# Patient Record
Sex: Female | Born: 1955 | Race: Black or African American | Hispanic: No | Smoking: Former smoker
Health system: Southern US, Community
[De-identification: ages and names within clinical notes are randomized; demographics above are authoritative.]

## PROBLEM LIST (undated history)

## (undated) DIAGNOSIS — N760 Acute vaginitis: Secondary | ICD-10-CM

## (undated) DIAGNOSIS — I10 Essential (primary) hypertension: Secondary | ICD-10-CM

## (undated) DIAGNOSIS — J302 Other seasonal allergic rhinitis: Secondary | ICD-10-CM

## (undated) DIAGNOSIS — IMO0001 Reserved for inherently not codable concepts without codable children: Secondary | ICD-10-CM

## (undated) DIAGNOSIS — L0291 Cutaneous abscess, unspecified: Secondary | ICD-10-CM

## (undated) DIAGNOSIS — J45909 Unspecified asthma, uncomplicated: Secondary | ICD-10-CM

## (undated) DIAGNOSIS — N824 Other female intestinal-genital tract fistulae: Secondary | ICD-10-CM

## (undated) DIAGNOSIS — E669 Obesity, unspecified: Secondary | ICD-10-CM

## (undated) DIAGNOSIS — E785 Hyperlipidemia, unspecified: Secondary | ICD-10-CM

## (undated) DIAGNOSIS — L0390 Cellulitis, unspecified: Secondary | ICD-10-CM

## (undated) DIAGNOSIS — K219 Gastro-esophageal reflux disease without esophagitis: Secondary | ICD-10-CM

## (undated) DIAGNOSIS — E1165 Type 2 diabetes mellitus with hyperglycemia: Secondary | ICD-10-CM

## (undated) HISTORY — DX: Unspecified asthma, uncomplicated: J45.909

## (undated) HISTORY — DX: Reserved for inherently not codable concepts without codable children: IMO0001

## (undated) HISTORY — DX: Cellulitis, unspecified: L03.90

## (undated) HISTORY — DX: Other seasonal allergic rhinitis: J30.2

## (undated) HISTORY — PX: ESOPHAGOGASTRODUODENOSCOPY: SHX1529

## (undated) HISTORY — DX: Obesity, unspecified: E66.9

## (undated) HISTORY — DX: Type 2 diabetes mellitus with hyperglycemia: E11.65

## (undated) HISTORY — DX: Other female intestinal-genital tract fistulae: N82.4

## (undated) HISTORY — DX: Essential (primary) hypertension: I10

## (undated) HISTORY — DX: Cutaneous abscess, unspecified: L02.91

## (undated) HISTORY — DX: Acute vaginitis: N76.0

## (undated) HISTORY — DX: Hyperlipidemia, unspecified: E78.5

## (undated) HISTORY — PX: OTHER SURGICAL HISTORY: SHX169

## (undated) HISTORY — DX: Gastro-esophageal reflux disease without esophagitis: K21.9

---

## 1999-07-19 ENCOUNTER — Encounter (INDEPENDENT_AMBULATORY_CARE_PROVIDER_SITE_OTHER): Payer: Self-pay | Admitting: Specialist

## 1999-07-19 ENCOUNTER — Other Ambulatory Visit: Admission: RE | Admit: 1999-07-19 | Discharge: 1999-07-19 | Payer: Self-pay | Admitting: Obstetrics and Gynecology

## 1999-11-24 DIAGNOSIS — E119 Type 2 diabetes mellitus without complications: Secondary | ICD-10-CM | POA: Insufficient documentation

## 2002-04-15 ENCOUNTER — Other Ambulatory Visit: Admission: RE | Admit: 2002-04-15 | Discharge: 2002-04-15 | Payer: Self-pay | Admitting: Obstetrics and Gynecology

## 2002-09-04 ENCOUNTER — Encounter: Payer: Self-pay | Admitting: Obstetrics and Gynecology

## 2002-09-04 ENCOUNTER — Encounter: Admission: RE | Admit: 2002-09-04 | Discharge: 2002-09-04 | Payer: Self-pay | Admitting: Obstetrics and Gynecology

## 2002-10-27 ENCOUNTER — Encounter (HOSPITAL_BASED_OUTPATIENT_CLINIC_OR_DEPARTMENT_OTHER): Payer: Self-pay | Admitting: General Surgery

## 2002-10-29 ENCOUNTER — Ambulatory Visit (HOSPITAL_COMMUNITY): Admission: RE | Admit: 2002-10-29 | Discharge: 2002-10-29 | Payer: Self-pay | Admitting: General Surgery

## 2002-10-29 ENCOUNTER — Encounter (INDEPENDENT_AMBULATORY_CARE_PROVIDER_SITE_OTHER): Payer: Self-pay | Admitting: *Deleted

## 2003-05-28 ENCOUNTER — Other Ambulatory Visit: Admission: RE | Admit: 2003-05-28 | Discharge: 2003-05-28 | Payer: Self-pay | Admitting: Obstetrics and Gynecology

## 2003-11-18 ENCOUNTER — Encounter: Admission: RE | Admit: 2003-11-18 | Discharge: 2003-11-18 | Payer: Self-pay | Admitting: Internal Medicine

## 2003-11-30 ENCOUNTER — Encounter: Admission: RE | Admit: 2003-11-30 | Discharge: 2003-11-30 | Payer: Self-pay | Admitting: Internal Medicine

## 2003-12-04 ENCOUNTER — Encounter: Admission: RE | Admit: 2003-12-04 | Discharge: 2003-12-04 | Payer: Self-pay | Admitting: Obstetrics and Gynecology

## 2004-06-16 ENCOUNTER — Other Ambulatory Visit: Admission: RE | Admit: 2004-06-16 | Discharge: 2004-06-16 | Payer: Self-pay | Admitting: Obstetrics and Gynecology

## 2004-12-15 ENCOUNTER — Encounter: Admission: RE | Admit: 2004-12-15 | Discharge: 2004-12-15 | Payer: Self-pay | Admitting: Obstetrics and Gynecology

## 2005-04-13 ENCOUNTER — Encounter: Admission: RE | Admit: 2005-04-13 | Discharge: 2005-04-13 | Payer: Self-pay | Admitting: Internal Medicine

## 2005-06-07 ENCOUNTER — Other Ambulatory Visit: Admission: RE | Admit: 2005-06-07 | Discharge: 2005-06-07 | Payer: Self-pay | Admitting: Obstetrics and Gynecology

## 2005-09-25 ENCOUNTER — Encounter: Admission: RE | Admit: 2005-09-25 | Discharge: 2005-09-25 | Payer: Self-pay | Admitting: Gastroenterology

## 2006-03-20 ENCOUNTER — Ambulatory Visit (HOSPITAL_COMMUNITY): Admission: RE | Admit: 2006-03-20 | Discharge: 2006-03-20 | Payer: Self-pay | Admitting: Internal Medicine

## 2006-03-20 ENCOUNTER — Encounter (INDEPENDENT_AMBULATORY_CARE_PROVIDER_SITE_OTHER): Payer: Self-pay | Admitting: *Deleted

## 2006-04-09 ENCOUNTER — Encounter: Admission: RE | Admit: 2006-04-09 | Discharge: 2006-04-09 | Payer: Self-pay | Admitting: Obstetrics and Gynecology

## 2006-05-01 ENCOUNTER — Other Ambulatory Visit: Admission: RE | Admit: 2006-05-01 | Discharge: 2006-05-01 | Payer: Self-pay | Admitting: Obstetrics and Gynecology

## 2006-06-04 ENCOUNTER — Ambulatory Visit: Payer: Self-pay | Admitting: Gastroenterology

## 2006-06-15 ENCOUNTER — Ambulatory Visit: Payer: Self-pay | Admitting: Gastroenterology

## 2006-08-31 ENCOUNTER — Ambulatory Visit (HOSPITAL_COMMUNITY): Admission: RE | Admit: 2006-08-31 | Discharge: 2006-08-31 | Payer: Self-pay | Admitting: Gastroenterology

## 2006-09-18 ENCOUNTER — Ambulatory Visit (HOSPITAL_COMMUNITY): Admission: RE | Admit: 2006-09-18 | Discharge: 2006-09-18 | Payer: Self-pay | Admitting: Gastroenterology

## 2006-10-24 ENCOUNTER — Ambulatory Visit (HOSPITAL_COMMUNITY): Admission: RE | Admit: 2006-10-24 | Discharge: 2006-10-24 | Payer: Self-pay | Admitting: Gastroenterology

## 2007-03-29 ENCOUNTER — Encounter: Admission: RE | Admit: 2007-03-29 | Discharge: 2007-03-29 | Payer: Self-pay | Admitting: Internal Medicine

## 2007-04-05 ENCOUNTER — Encounter (HOSPITAL_BASED_OUTPATIENT_CLINIC_OR_DEPARTMENT_OTHER): Payer: Self-pay | Admitting: General Surgery

## 2007-04-05 ENCOUNTER — Ambulatory Visit (HOSPITAL_BASED_OUTPATIENT_CLINIC_OR_DEPARTMENT_OTHER): Admission: RE | Admit: 2007-04-05 | Discharge: 2007-04-05 | Payer: Self-pay | Admitting: General Surgery

## 2007-06-12 ENCOUNTER — Encounter: Admission: RE | Admit: 2007-06-12 | Discharge: 2007-06-12 | Payer: Self-pay | Admitting: Obstetrics and Gynecology

## 2008-05-28 ENCOUNTER — Encounter: Admission: RE | Admit: 2008-05-28 | Discharge: 2008-05-28 | Payer: Self-pay | Admitting: Obstetrics and Gynecology

## 2008-09-01 IMAGING — CR DG KNEE 1-2V*L*
2 series · 2 of 2 positions shown · non-contrast
Comparison: none

CLINICAL DATA: Pain.
 LEFT KNEE - 2 VIEW:

[t knee ap left]
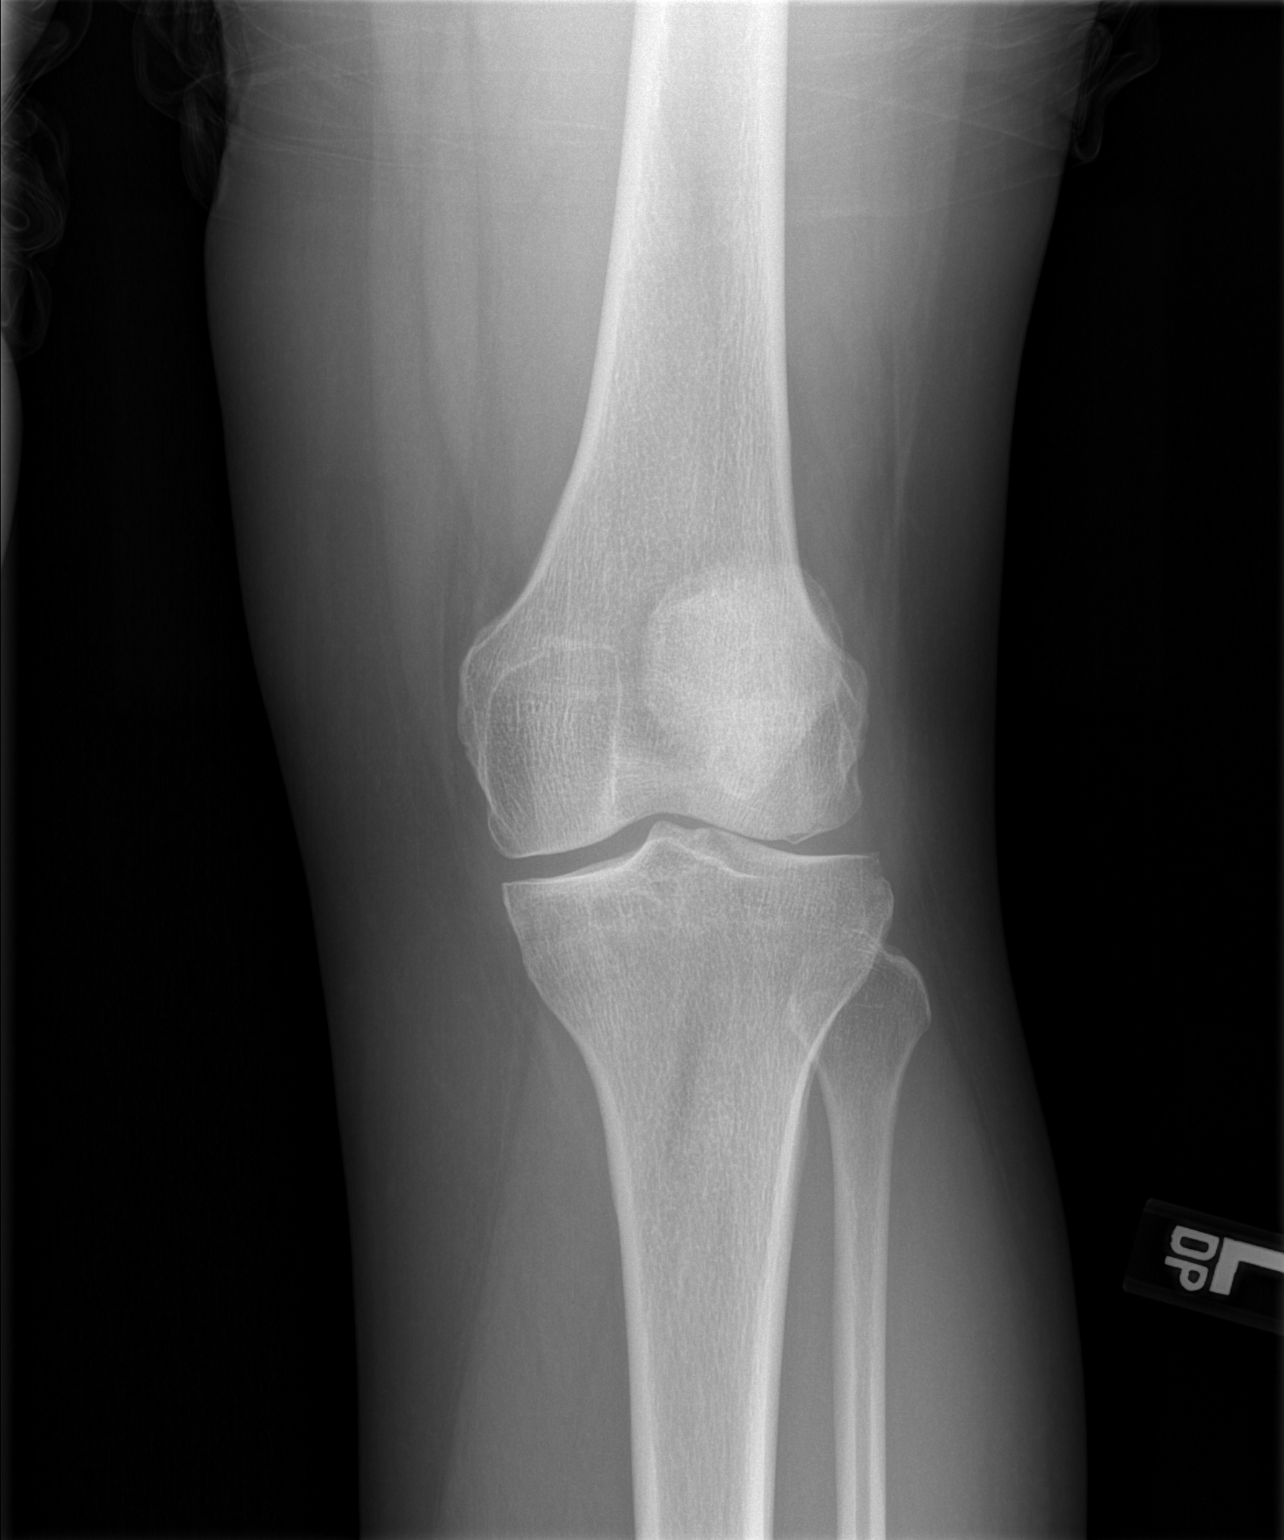

[t knee lat left]
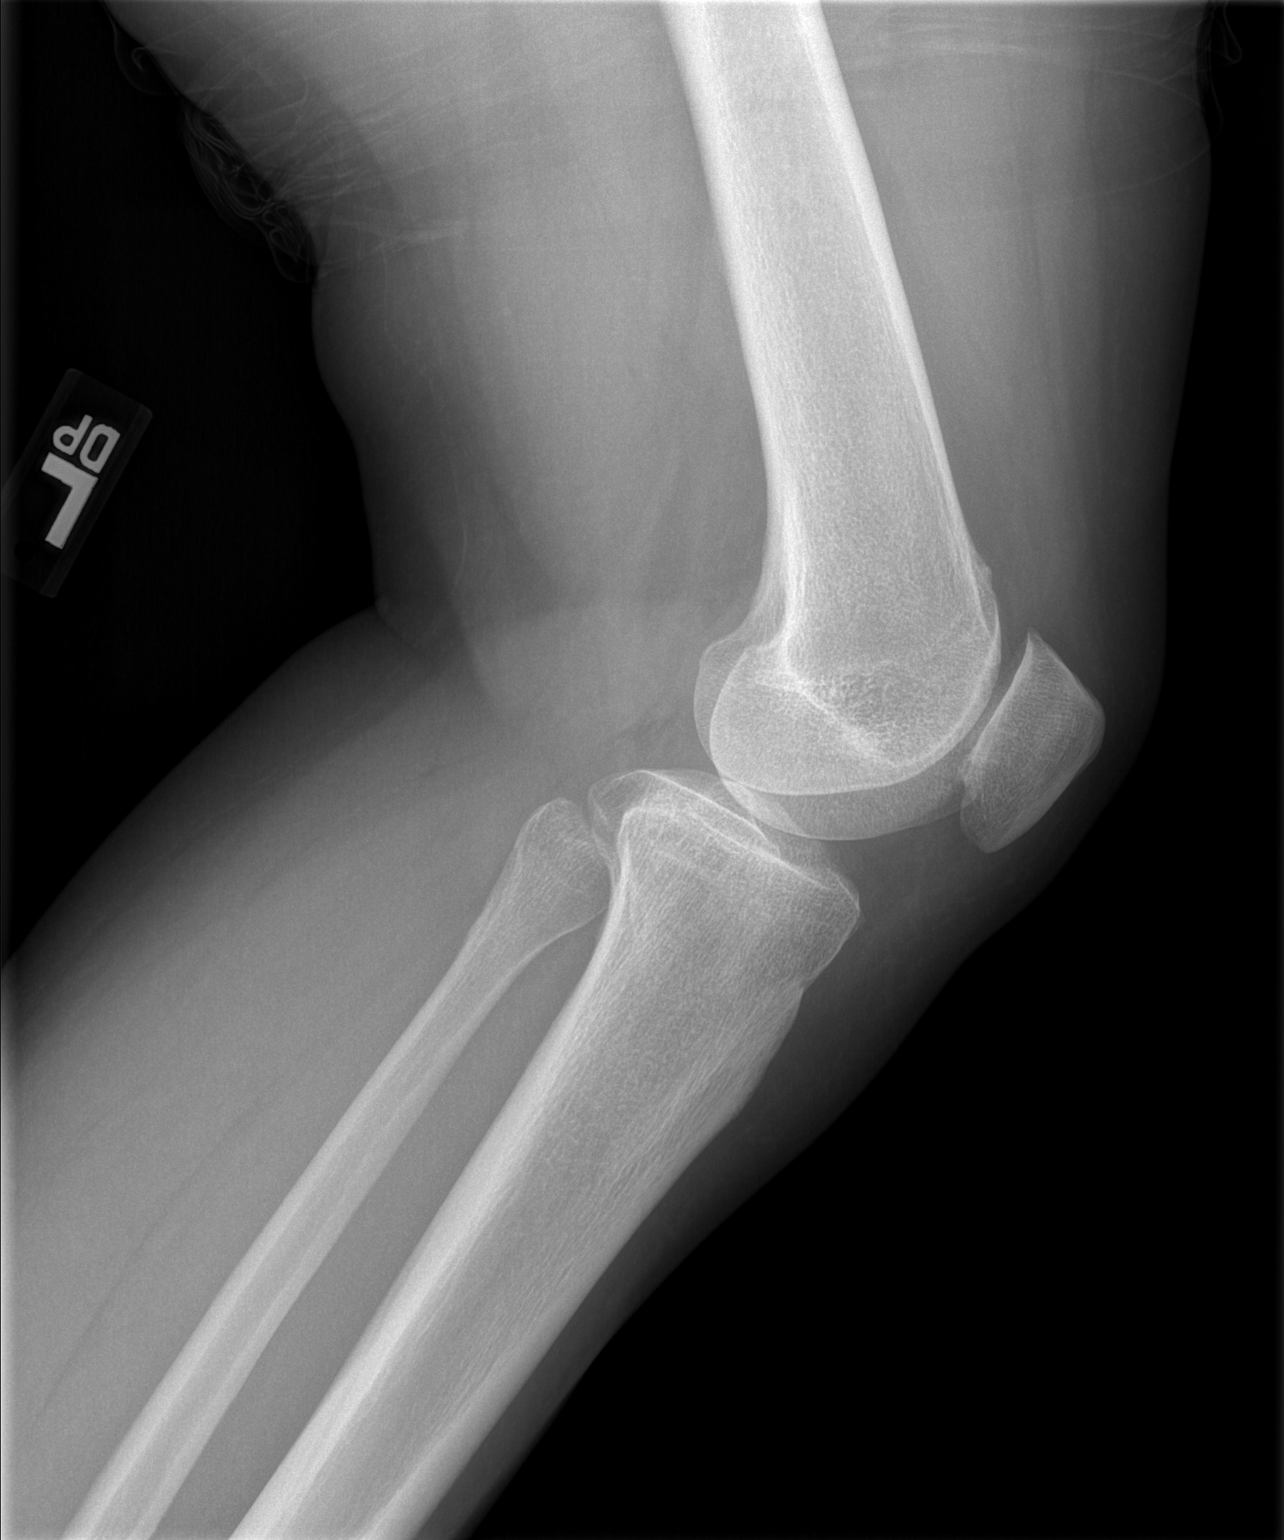

[2 of 2 positions shown; findings below may reference images not displayed]

FINDINGS: Two views of the left knee show no acute abnormality.  The joint spaces are relatively normal, and no effusion is seen.
IMPRESSION: Negative left. 
 RIGHT KNEE - 2 VIEW:
FINDINGS: Two views of the right knee show joint spaces to be normal.  No effusion is seen.
IMPRESSION: Negative right knee.

## 2009-07-12 ENCOUNTER — Other Ambulatory Visit: Admission: RE | Admit: 2009-07-12 | Discharge: 2009-07-12 | Payer: Self-pay | Admitting: Internal Medicine

## 2009-07-12 ENCOUNTER — Observation Stay (HOSPITAL_COMMUNITY): Admission: AD | Admit: 2009-07-12 | Discharge: 2009-07-16 | Payer: Self-pay | Admitting: Internal Medicine

## 2009-07-12 ENCOUNTER — Encounter: Payer: Self-pay | Admitting: Internal Medicine

## 2009-08-15 ENCOUNTER — Emergency Department (HOSPITAL_COMMUNITY): Admission: EM | Admit: 2009-08-15 | Discharge: 2009-08-15 | Payer: Self-pay | Admitting: Emergency Medicine

## 2011-01-27 LAB — BASIC METABOLIC PANEL WITH GFR
BUN: 6 mg/dL (ref 6–23)
CO2: 25 meq/L (ref 19–32)
Calcium: 9.5 mg/dL (ref 8.4–10.5)
Chloride: 103 meq/L (ref 96–112)
Creatinine, Ser: 0.74 mg/dL (ref 0.4–1.2)
GFR calc non Af Amer: >60 mL/min
Glucose, Bld: 205 mg/dL — ABNORMAL HIGH (ref 70–99)
Potassium: 3.9 meq/L (ref 3.5–5.1)
Sodium: 137 meq/L (ref 135–145)

## 2011-01-27 LAB — GLUCOSE, CAPILLARY
Glucose-Capillary: 114 mg/dL — ABNORMAL HIGH (ref 70–99)
Glucose-Capillary: 134 mg/dL — ABNORMAL HIGH (ref 70–99)
Glucose-Capillary: 136 mg/dL — ABNORMAL HIGH (ref 70–99)
Glucose-Capillary: 139 mg/dL — ABNORMAL HIGH (ref 70–99)
Glucose-Capillary: 146 mg/dL — ABNORMAL HIGH (ref 70–99)
Glucose-Capillary: 169 mg/dL — ABNORMAL HIGH (ref 70–99)
Glucose-Capillary: 177 mg/dL — ABNORMAL HIGH (ref 70–99)
Glucose-Capillary: 214 mg/dL — ABNORMAL HIGH (ref 70–99)
Glucose-Capillary: 215 mg/dL — ABNORMAL HIGH (ref 70–99)
Glucose-Capillary: 274 mg/dL — ABNORMAL HIGH (ref 70–99)
Glucose-Capillary: 283 mg/dL — ABNORMAL HIGH (ref 70–99)
Glucose-Capillary: 284 mg/dL — ABNORMAL HIGH (ref 70–99)
Glucose-Capillary: 327 mg/dL — ABNORMAL HIGH (ref 70–99)

## 2011-01-27 LAB — URINALYSIS, ROUTINE W REFLEX MICROSCOPIC
Glucose, UA: NEGATIVE mg/dL
Hgb urine dipstick: NEGATIVE
Ketones, ur: 15 mg/dL — AB
Protein, ur: NEGATIVE mg/dL
pH: 5 (ref 5.0–8.0)

## 2011-01-27 LAB — COMPREHENSIVE METABOLIC PANEL
ALT: 40 U/L — ABNORMAL HIGH (ref 0–35)
ALT: 40 U/L — ABNORMAL HIGH (ref 0–35)
AST: 39 U/L — ABNORMAL HIGH (ref 0–37)
AST: 46 U/L — ABNORMAL HIGH (ref 0–37)
Albumin: 3.5 g/dL (ref 3.5–5.2)
Albumin: 3.6 g/dL (ref 3.5–5.2)
Albumin: 4.1 g/dL (ref 3.5–5.2)
Alkaline Phosphatase: 98 U/L (ref 39–117)
BUN: 10 mg/dL (ref 6–23)
CO2: 25 mEq/L (ref 19–32)
Calcium: 9.3 mg/dL (ref 8.4–10.5)
Calcium: 9.6 mg/dL (ref 8.4–10.5)
Chloride: 98 mEq/L (ref 96–112)
Creatinine, Ser: 0.77 mg/dL (ref 0.4–1.2)
GFR calc Af Amer: >60 mL/min (ref >60)
GFR calc Af Amer: >60 mL/min (ref >60)
GFR calc non Af Amer: 59 mL/min — ABNORMAL LOW (ref >60)
GFR calc non Af Amer: >60 mL/min (ref >60)
Potassium: 4 mEq/L (ref 3.5–5.1)
Sodium: 135 mEq/L (ref 135–145)
Sodium: 136 mEq/L (ref 135–145)
Total Bilirubin: 0.5 mg/dL (ref 0.3–1.2)
Total Protein: 6.2 g/dL (ref 6.0–8.3)

## 2011-01-27 LAB — CBC
MCHC: 33.6 g/dL (ref 30.0–36.0)
Platelets: 339 10*3/uL (ref 150–400)
RBC: 5.19 MIL/uL — ABNORMAL HIGH (ref 3.87–5.11)
WBC: 11.2 10*3/uL — ABNORMAL HIGH (ref 4.0–10.5)

## 2011-01-27 LAB — LIPID PANEL
HDL: 37 mg/dL — ABNORMAL LOW
Total CHOL/HDL Ratio: 4.8 ratio
Triglycerides: 162 mg/dL — ABNORMAL HIGH
VLDL: 32 mg/dL (ref 0–40)

## 2011-01-27 LAB — DIFFERENTIAL
Eosinophils Absolute: 0.3 10*3/uL (ref 0.0–0.7)
Eosinophils Relative: 3 % (ref 0–5)
Lymphocytes Relative: 38 % (ref 12–46)
Lymphs Abs: 4.2 10*3/uL — ABNORMAL HIGH (ref 0.7–4.0)
Monocytes Absolute: 0.7 10*3/uL (ref 0.1–1.0)
Monocytes Relative: 6 % (ref 3–12)

## 2011-01-27 LAB — URINE MICROSCOPIC-ADD ON

## 2011-01-27 LAB — URINE CULTURE

## 2011-02-08 ENCOUNTER — Encounter: Payer: Self-pay | Admitting: Internal Medicine

## 2011-02-10 ENCOUNTER — Encounter: Payer: Self-pay | Admitting: Internal Medicine

## 2011-03-07 NOTE — Op Note (Signed)
Jasmine Harris, Jasmine Harris              ACCOUNT NO.:  [id number]   MEDICAL RECORD NO.:  [id number]          PATIENT TYPE:  AMB   LOCATION:  DSC                          FACILITY:  [facility]   PHYSICIAN:  Leonie Man, M.D.   DATE OF BIRTH:  December 17, 1955   DATE OF PROCEDURE:  04/05/2007  DATE OF DISCHARGE:                               OPERATIVE REPORT   PREOPERATIVE DIAGNOSIS:  Hidradenitis of bilateral groins.   POSTOPERATIVE DIAGNOSIS:  Hidradenitis of bilateral groins.   PROCEDURE:  Excision, hidradenitis, bilateral groins.   SURGEON:  Leonie Man, M.D.   ASSISTANT:  OR tech.   ANESTHESIA:  General.   SPECIMENS TO LAB:  Skin from the right the left groin.   ESTIMATED BLOOD LOSS:  Minimal.   COMPLICATIONS:  None.   The patient returned to the PACU in excellent condition.   Jasmine Harris is a 55 year old female with bilateral groin  hidradenitis refractory to conservative therapy.  The patient was placed  on a 6-week course of antibiotics and now brought to the operating room  for excision.  The risks and potential benefits of surgery were well-  known to the patient and discussed, all questions answered and consent  obtained.   PROCEDURE:  The patient is positioned supinely following induction of  satisfactory anesthesia.  She is froglegged so as to get access to the  groin creases.  The area of both groins are prepped and draped to be  included in a sterile operative field.  I infiltrated the right groin  with 0.25% Marcaine with epinephrine after having outlined the area of  hidradenitis.  This was incised down upon and deepened through the skin  and subcutaneous tissues, raising a significant enough skin flap to  remove all of the area of hidradenitis underneath the skin flaps.  This  entire excision measured approximately 10 cm in greatest diameter and  was removed and forwarded for pathologic evaluation.  Hemostasis  obtained with electrocautery.  Subcutaneous  tissues closed with  interrupted 2-0 Vicryl sutures.  Skin closed with a running 4-0 Monocryl  suture and then reinforced with Steri-Strips.   Attention turned to the right groin, where a somewhat larger area of  hidradenitis was marked out with a skin marker and this area infiltrated  with 0.25% Marcaine with epinephrine.  A large elliptical incision is  carried down over this area, deepened through the skin and subcutaneous  tissue and excising this entire area and removing it for pathologic  evaluation.  This measured approximately 15 cm.  Hemostasis was obtained  with electrocautery.  Sponge and instrument counts were verified.  Subcutaneous tissues closed with interrupted 2-0 Vicryl sutures.  Skin  closed with  running 4-0 Monocryl suture, reinforced with Steri-Strips and a sterile  dressing applied.  The anesthetic was reversed, the patient removed from  the operating room to the recovery room in stable condition.  She  tolerated the procedure well.      Leonie Man, M.D.  Electronically Signed     PB/MEDQ  D:  04/05/2007  T:  04/05/2007  Job:  [id number]

## 2011-03-10 NOTE — Op Note (Signed)
   NAME:  Jasmine Harris, Jasmine Harris                        ACCOUNT NO.:  [id number]   MEDICAL RECORD NO.:  [id number]                   PATIENT TYPE:  OIB   LOCATION:  2891                                 FACILITY:  [facility]   PHYSICIAN:  Leonie Man, M.D.                DATE OF BIRTH:  1955-11-11   DATE OF PROCEDURE:  10/29/2002  DATE OF DISCHARGE:                                 OPERATIVE REPORT   PREOPERATIVE DIAGNOSIS:  Bilateral groin hidradenitis.   POSTOPERATIVE DIAGNOSIS:  Bilateral groin hidradenitis.   PROCEDURE:  Excision of bilateral groin hidradenitis with primary closure.   SURGEON:  Leonie Man, M.D.   ASSISTANT:  Nurse.   ANESTHESIA:  General.   INDICATION:  The patient is a 55 year old woman with recurring and worsening  bilateral groin hidradenitis.  She desires excision of these areas of  hidradenitis and comes to the operating room after the risks and benefits of  surgery have been discussed and consent obtained.   DESCRIPTION OF PROCEDURE:  Following induction of satisfactory general  anesthesia, the groins were prepped and draped to be included in a sterile  operative field with the patient's leg frog-legged.  The region of  hidradenitis was outlined with an ellipse and infiltrated with 0.5% Marcaine  with epinephrine.  Elliptical incisions are excised in the entire area  hidradenitis both in right and left groins, taken down and carrying the  dissection down into the subcutaneous fat.  The lesions were removed and  forwarded for pathologic evaluation.  Hemostasis obtained with  electrocautery.  Some undermining had to be done so as to close the wounds  without tension. Subcutaneous tissue was closed with interrupted 3-0 Vicryl  sutures, skin closed with running 4-0 Monocryl suture in both wounds.  Sterile dressings were applied.  The anesthetic was reversed and the patient  removed from the operating room to the recovery room in stable condition.  She  tolerated the procedure well.                                                Leonie Man, M.D.    PB/MEDQ  D:  10/29/2002  T:  10/29/2002  Job:  [id number]

## 2011-06-28 ENCOUNTER — Encounter: Payer: Self-pay | Admitting: Gastroenterology

## 2011-08-10 LAB — DIFFERENTIAL
Basophils Relative: 1
Eosinophils Absolute: 0.4
Eosinophils Relative: 4
Monocytes Absolute: 0.7
Monocytes Relative: 7
Neutrophils Relative %: 45

## 2011-08-10 LAB — CBC
Hemoglobin: 11.9 — ABNORMAL LOW
RBC: 4.53
RDW: 13.5

## 2011-08-10 LAB — COMPREHENSIVE METABOLIC PANEL
ALT: 24
AST: 21
Alkaline Phosphatase: 74
GFR calc Af Amer: >60
Glucose, Bld: 104 — ABNORMAL HIGH
Potassium: 3.3 — ABNORMAL LOW
Sodium: 137
Total Protein: 6.6

## 2011-08-10 LAB — POCT HEMOGLOBIN-HEMACUE: Hemoglobin: 12.9

## 2011-12-20 ENCOUNTER — Ambulatory Visit: Payer: Self-pay | Admitting: Obstetrics and Gynecology

## 2012-01-04 ENCOUNTER — Ambulatory Visit (INDEPENDENT_AMBULATORY_CARE_PROVIDER_SITE_OTHER): Payer: Self-pay | Admitting: Obstetrics and Gynecology

## 2012-01-04 DIAGNOSIS — Z01419 Encounter for gynecological examination (general) (routine) without abnormal findings: Secondary | ICD-10-CM

## 2012-07-25 ENCOUNTER — Encounter: Payer: Self-pay | Admitting: Gastroenterology

## 2012-12-23 LAB — BASIC METABOLIC PANEL
Creatinine: 0.8 mg/dL (ref 0.5–1.1)
Potassium: 3.8 mmol/L (ref 3.4–5.3)
Sodium: 138 mmol/L (ref 137–147)

## 2012-12-23 LAB — HEPATIC FUNCTION PANEL
ALT: 18 U/L (ref 7–35)
AST: 15 U/L (ref 13–35)
Alkaline Phosphatase: 78 U/L (ref 25–125)

## 2013-01-06 ENCOUNTER — Ambulatory Visit: Payer: Self-pay | Admitting: Obstetrics and Gynecology

## 2013-03-03 ENCOUNTER — Encounter: Payer: Self-pay | Admitting: General Practice

## 2014-02-06 ENCOUNTER — Encounter (HOSPITAL_COMMUNITY): Payer: Self-pay | Admitting: Emergency Medicine

## 2014-02-06 ENCOUNTER — Emergency Department (HOSPITAL_COMMUNITY)
Admission: EM | Admit: 2014-02-06 | Discharge: 2014-02-06 | Disposition: A | Discharge status: Home / Self Care | Payer: PRIVATE HEALTH INSURANCE | Attending: Emergency Medicine | Admitting: Emergency Medicine

## 2014-02-06 DIAGNOSIS — R739 Hyperglycemia, unspecified: Secondary | ICD-10-CM

## 2014-02-06 DIAGNOSIS — R5383 Other fatigue: Secondary | ICD-10-CM

## 2014-02-06 DIAGNOSIS — J45909 Unspecified asthma, uncomplicated: Secondary | ICD-10-CM | POA: Insufficient documentation

## 2014-02-06 DIAGNOSIS — E669 Obesity, unspecified: Secondary | ICD-10-CM | POA: Insufficient documentation

## 2014-02-06 DIAGNOSIS — R5381 Other malaise: Secondary | ICD-10-CM | POA: Insufficient documentation

## 2014-02-06 DIAGNOSIS — IMO0001 Reserved for inherently not codable concepts without codable children: Secondary | ICD-10-CM | POA: Insufficient documentation

## 2014-02-06 DIAGNOSIS — Z872 Personal history of diseases of the skin and subcutaneous tissue: Secondary | ICD-10-CM | POA: Insufficient documentation

## 2014-02-06 DIAGNOSIS — Z87440 Personal history of urinary (tract) infections: Secondary | ICD-10-CM | POA: Insufficient documentation

## 2014-02-06 DIAGNOSIS — Z79899 Other long term (current) drug therapy: Secondary | ICD-10-CM | POA: Insufficient documentation

## 2014-02-06 DIAGNOSIS — Z794 Long term (current) use of insulin: Secondary | ICD-10-CM | POA: Insufficient documentation

## 2014-02-06 DIAGNOSIS — E1165 Type 2 diabetes mellitus with hyperglycemia: Principal | ICD-10-CM

## 2014-02-06 LAB — COMPREHENSIVE METABOLIC PANEL
ALT: 18 U/L (ref 0–35)
AST: 17 U/L (ref 0–37)
Albumin: 3.5 g/dL (ref 3.5–5.2)
Alkaline Phosphatase: 91 U/L (ref 39–117)
BUN: 14 mg/dL (ref 6–23)
CALCIUM: 9.4 mg/dL (ref 8.4–10.5)
CHLORIDE: 103 meq/L (ref 96–112)
CO2: 22 mEq/L (ref 19–32)
CREATININE: 0.53 mg/dL (ref 0.50–1.10)
GFR calc Af Amer: >90 mL/min (ref >90)
Glucose, Bld: 133 mg/dL — ABNORMAL HIGH (ref 70–99)
Potassium: 3.7 mEq/L (ref 3.7–5.3)
Sodium: 140 mEq/L (ref 137–147)
Total Bilirubin: 0.2 mg/dL — ABNORMAL LOW (ref 0.3–1.2)
Total Protein: 6.4 g/dL (ref 6.0–8.3)

## 2014-02-06 LAB — CBC
HCT: 40.9 % (ref 36.0–46.0)
HEMOGLOBIN: 13.4 g/dL (ref 12.0–15.0)
MCH: 25.4 pg — ABNORMAL LOW (ref 26.0–34.0)
MCHC: 32.8 g/dL (ref 30.0–36.0)
MCV: 77.5 fL — ABNORMAL LOW (ref 78.0–100.0)
PLATELETS: 271 10*3/uL (ref 150–400)
RBC: 5.28 MIL/uL — AB (ref 3.87–5.11)
RDW: 13.4 % (ref 11.5–15.5)
WBC: 9.2 10*3/uL (ref 4.0–10.5)

## 2014-02-06 LAB — CBG MONITORING, ED
GLUCOSE-CAPILLARY: 153 mg/dL — AB (ref 70–99)
Glucose-Capillary: 124 mg/dL — ABNORMAL HIGH (ref 70–99)

## 2014-02-06 NOTE — ED Notes (Signed)
She states she feels her blood sugar dropping, checked and it was 65, given soda and crackers

## 2014-02-06 NOTE — ED Notes (Signed)
She reports her blood sugar has been high and she has not been able to lower it at home. Was recently treated for a yeast infection and feels that may have caused her blood sugar to increase. She c/o fatigue and headache this week also

## 2014-02-06 NOTE — Discharge Instructions (Signed)
Continue to log her blood sugars as you've been doing. Keep in contact with your primary care Dr. Sinclair Grooms usual sliding scale. If you use a sliding scale make sure that you do eat some food. Return for any new or worse symptoms.

## 2014-02-06 NOTE — ED Notes (Signed)
Pt reports having difficulty controlling her blood sugar at home.  Denies any symptoms related to high CBG; increased urination or thirst.

## 2014-02-06 NOTE — ED Provider Notes (Signed)
CSN: [id number]     Arrival date & time 02/06/14  1205 History   First MD Initiated Contact with Patient 02/06/14 1603     Chief Complaint  Patient presents with  . Hyperglycemia     (Consider location/radiation/quality/duration/timing/severity/associated sxs/prior Treatment) Patient is a 58 y.o. female presenting with hyperglycemia. The history is provided by the patient.  Hyperglycemia Associated symptoms: fatigue   Associated symptoms: no abdominal pain, no chest pain, no confusion, no dysuria, no fever and no shortness of breath    patient with history of diabetes. Patients on metformin twice a day and a sliding scale insulin. Followed by Dr. Marlou Sa. For the last several days patient's been having trouble with her regular blood sugars and blood sugar getting high. Up today at work patient blood sugar was 3:30 and then she went home from work and it was 450 9 in the morning. She used her sliding scale insulin. And contacted her doctor and was directed to come in here. Patient's only other symptoms have been some fatigue of late. There has been no recent change in her blood sugar medicine.  Past Medical History  Diagnosis Date  . Type II or unspecified type diabetes mellitus without mention of complication, uncontrolled   . Cellulitis and abscess of unspecified site   . Obesity, unspecified   . Digestive-genital tract fistula, female   . Vaginitis and vulvovaginitis, unspecified   . Asthma    History reviewed. No pertinent past surgical history. History reviewed. No pertinent family history. History  Substance Use Topics  . Smoking status: Not on file  . Smokeless tobacco: Not on file  . Alcohol Use: Not on file   OB History   Grav Para Term Preterm Abortions TAB SAB Ect Mult Living                 Review of Systems  Constitutional: Positive for fatigue. Negative for fever.  HENT: Negative for congestion.   Eyes: Negative for visual disturbance.  Respiratory: Negative for  shortness of breath.   Cardiovascular: Negative for chest pain.  Gastrointestinal: Negative for abdominal pain.  Genitourinary: Negative for dysuria.  Musculoskeletal: Negative for myalgias.  Neurological: Negative for headaches.  Hematological: Does not bruise/bleed easily.  Psychiatric/Behavioral: Negative for confusion.      Allergies  Review of patient's allergies indicates no known allergies.  Home Medications   Prior to Admission medications   Medication Sig Start Date End Date Taking? Authorizing Provider  allopurinol (ZYLOPRIM) 300 MG tablet Take 300 mg by mouth daily.     Yes Historical Provider, MD  amitriptyline (ELAVIL) 10 MG tablet Take 10 mg by mouth 3 (three) times daily.   Yes Historical Provider, MD  cetirizine (ZYRTEC) 10 MG tablet Take 10 mg by mouth daily.   Yes Historical Provider, MD  furosemide (LASIX) 40 MG tablet Take 40 mg by mouth daily.     Yes Historical Provider, MD  insulin glargine (LANTUS) 100 UNIT/ML injection Inject into the skin. 9 units in am & 10 units at bedtime    Yes Historical Provider, MD   BP 117/63  Pulse 59  Temp(Src) 98.5 F (36.9 C) (Oral)  Resp 18  Ht 5\' 2"  (1.575 m)  Wt 193 lb 8 oz (87.771 kg)  BMI 35.38 kg/m2  SpO2 97% Physical Exam  Nursing note and vitals reviewed. Constitutional: She is oriented to person, place, and time. She appears well-developed and well-nourished. No distress.  HENT:  Head: Normocephalic and atraumatic.  Mouth/Throat:  Oropharynx is clear and moist.  Eyes: Conjunctivae and EOM are normal. Pupils are equal, round, and reactive to light.  Neck: Normal range of motion.  Cardiovascular: Normal rate, regular rhythm and normal heart sounds.   No murmur heard. Pulmonary/Chest: Effort normal and breath sounds normal. No respiratory distress.  Abdominal: Soft. Bowel sounds are normal. There is no tenderness.  Musculoskeletal: Normal range of motion. She exhibits no edema.  Neurological: She is alert and  oriented to person, place, and time. No cranial nerve deficit. She exhibits normal muscle tone. Coordination normal.  Skin: Skin is warm.    ED Course  Procedures (including critical care time) Labs Review Labs Reviewed  COMPREHENSIVE METABOLIC PANEL - Abnormal; Notable for the following:    Glucose, Bld 133 (*)    Total Bilirubin 0.2 (*)    All other components within normal limits  CBC - Abnormal; Notable for the following:    RBC 5.28 (*)    MCV 77.5 (*)    MCH 25.4 (*)    All other components within normal limits  CBG MONITORING, ED - Abnormal; Notable for the following:    Glucose-Capillary 124 (*)    All other components within normal limits  CBG MONITORING, ED - Abnormal; Notable for the following:    Glucose-Capillary 153 (*)    All other components within normal limits   Results for orders placed during the hospital encounter of 02/06/14  COMPREHENSIVE METABOLIC PANEL      Result Value Ref Range   Sodium 140  137 - 147 mEq/L   Potassium 3.7  3.7 - 5.3 mEq/L   Chloride 103  96 - 112 mEq/L   CO2 22  19 - 32 mEq/L   Glucose, Bld 133 (*) 70 - 99 mg/dL   BUN 14  6 - 23 mg/dL   Creatinine, Ser 0.53  0.50 - 1.10 mg/dL   Calcium 9.4  8.4 - 10.5 mg/dL   Total Protein 6.4  6.0 - 8.3 g/dL   Albumin 3.5  3.5 - 5.2 g/dL   AST 17  0 - 37 U/L   ALT 18  0 - 35 U/L   Alkaline Phosphatase 91  39 - 117 U/L   Total Bilirubin 0.2 (*) 0.3 - 1.2 mg/dL   GFR calc non Af Amer >90  >90 mL/min   GFR calc Af Amer >90  >90 mL/min  CBC      Result Value Ref Range   WBC 9.2  4.0 - 10.5 K/uL   RBC 5.28 (*) 3.87 - 5.11 MIL/uL   Hemoglobin 13.4  12.0 - 15.0 g/dL   HCT 40.9  36.0 - 46.0 %   MCV 77.5 (*) 78.0 - 100.0 fL   MCH 25.4 (*) 26.0 - 34.0 pg   MCHC 32.8  30.0 - 36.0 g/dL   RDW 13.4  11.5 - 15.5 %   Platelets 271  150 - 400 K/uL  CBG MONITORING, ED      Result Value Ref Range   Glucose-Capillary 124 (*) 70 - 99 mg/dL  CBG MONITORING, ED      Result Value Ref Range    Glucose-Capillary 153 (*) 70 - 99 mg/dL     Imaging Review No results found.   EKG Interpretation None      MDM   Final diagnoses:  Hyperglycemia    Patient with a history of diabetes. Treated with metformin and sliding scale. Of late she's been having trouble with swellings in her blood  sugars on the high side. This morning early at work her blood sugar was 3:30 and then at 9:00 at home it was 450 patient used her sliding scale. Came in here. After talking to her doctor. Patient did not get lunch because of being in the waiting room. Blood sugar to get low out in front patient felt the jittery and was given a snack and now feels better. Patient's blood sugars have tended to be high in the morning she takes her metformin twice a day once before bed and once first thing in the morning. And uses a sliding scale in between. Today's labs are very normal patient does not have any historical evidence of infection. No evidence of ketoacidosis. Nothing particular to address here today. Keeping a log and close followup with her with her doctor Dr. Marlou Sa would be important. Patient is nontoxic no acute distress can be discharged home.    Mervin Kung, MD 02/06/14 [phone]

## 2014-02-10 LAB — CBG MONITORING, ED
GLUCOSE-CAPILLARY: 65 mg/dL — AB (ref 70–99)
GLUCOSE-CAPILLARY: 162 mg/dL — AB (ref 70–99)

## 2014-03-03 ENCOUNTER — Other Ambulatory Visit: Payer: Self-pay | Admitting: Family Medicine

## 2014-03-03 DIAGNOSIS — Z1231 Encounter for screening mammogram for malignant neoplasm of breast: Secondary | ICD-10-CM

## 2014-03-13 ENCOUNTER — Encounter (INDEPENDENT_AMBULATORY_CARE_PROVIDER_SITE_OTHER): Payer: Self-pay

## 2014-03-13 ENCOUNTER — Ambulatory Visit
Admission: RE | Admit: 2014-03-13 | Discharge: 2014-03-13 | Disposition: A | Discharge status: Home / Self Care | Payer: PRIVATE HEALTH INSURANCE | Source: Ambulatory Visit | Attending: Family Medicine | Admitting: Family Medicine

## 2014-03-13 DIAGNOSIS — Z1231 Encounter for screening mammogram for malignant neoplasm of breast: Secondary | ICD-10-CM

## 2015-04-01 DIAGNOSIS — I10 Essential (primary) hypertension: Secondary | ICD-10-CM | POA: Insufficient documentation

## 2015-08-19 ENCOUNTER — Encounter: Payer: Self-pay | Admitting: Gastroenterology

## 2015-10-08 ENCOUNTER — Encounter: Payer: Self-pay | Admitting: Gastroenterology

## 2015-10-24 HISTORY — PX: COLONOSCOPY: SHX174

## 2015-10-26 ENCOUNTER — Encounter: Payer: PRIVATE HEALTH INSURANCE | Admitting: Gastroenterology

## 2015-12-01 ENCOUNTER — Ambulatory Visit (AMBULATORY_SURGERY_CENTER): Payer: Self-pay

## 2015-12-01 VITALS — Ht 62.0 in | Wt 203.2 lb

## 2015-12-01 DIAGNOSIS — Z800 Family history of malignant neoplasm of digestive organs: Secondary | ICD-10-CM

## 2015-12-01 MED ORDER — SUPREP BOWEL PREP KIT 17.5-3.13-1.6 GM/177ML PO SOLN: 1.0000 | Freq: Once | ORAL | Status: DC

## 2015-12-01 NOTE — Progress Notes (Signed)
No allergies to eggs or soy No home oxygen No past problems with anesthesia No diet/weight loss meds  Has email and internet; registered for emmi

## 2015-12-15 ENCOUNTER — Encounter: Payer: PRIVATE HEALTH INSURANCE | Admitting: Gastroenterology

## 2016-01-05 DIAGNOSIS — K5730 Diverticulosis of large intestine without perforation or abscess without bleeding: Secondary | ICD-10-CM | POA: Insufficient documentation

## 2016-01-10 DIAGNOSIS — D126 Benign neoplasm of colon, unspecified: Secondary | ICD-10-CM | POA: Insufficient documentation

## 2017-11-27 DIAGNOSIS — R0600 Dyspnea, unspecified: Secondary | ICD-10-CM | POA: Insufficient documentation

## 2017-11-27 DIAGNOSIS — R072 Precordial pain: Secondary | ICD-10-CM | POA: Insufficient documentation

## 2017-11-27 DIAGNOSIS — R9431 Abnormal electrocardiogram [ECG] [EKG]: Secondary | ICD-10-CM | POA: Insufficient documentation

## 2019-10-09 ENCOUNTER — Telehealth: Payer: Self-pay | Admitting: Gastroenterology

## 2019-10-31 DIAGNOSIS — R9439 Abnormal result of other cardiovascular function study: Secondary | ICD-10-CM | POA: Insufficient documentation

## 2019-10-31 DIAGNOSIS — I200 Unstable angina: Secondary | ICD-10-CM | POA: Insufficient documentation

## 2019-11-14 ENCOUNTER — Ambulatory Visit: Payer: 59 | Admitting: Gastroenterology

## 2019-11-14 ENCOUNTER — Encounter: Payer: Self-pay | Admitting: Gastroenterology

## 2019-11-14 VITALS — BP 130/70 | HR 80 | Temp 98.3°F | Ht 62.0 in | Wt 195.4 lb

## 2019-11-14 DIAGNOSIS — R1011 Right upper quadrant pain: Secondary | ICD-10-CM

## 2019-11-14 DIAGNOSIS — R0789 Other chest pain: Secondary | ICD-10-CM | POA: Diagnosis not present

## 2019-11-14 NOTE — Progress Notes (Signed)
HPI: This is a very pleasant 64 year old woman who was referred to me by Yong Channel*  to evaluate indigestion, chest pressure.    I last saw her about 14 years ago the time of a colonoscopy.  See those results summarized below.  I spent quite a while reviewing her records since then had several different outside facilities.  See those below.  She tells me for the past year she has had intermittent right upper quadrant pains that are spasm-like pains associate with some swelling in her abdomen.  These occur about once a week, last 5 to 10 minutes.  They are not associate with nausea or vomiting.  The pain does not radiate anywhere.  She also has intermittent indigestion which when asked her further is really an intermittent chest pressure.  This is not always associated with exercise but can be.  It is not usually associated with eating.  She was put on Carafate by her primary care physician about a month ago for this and that did not help.  She was seen by cardiologist, failed a stress test.  She was put on Imdur that does seem to help the chest pressure.   Old Data Reviewed: We received a referral for her about 5 weeks ago.  This was for "uncontrolled heartburn/chest pressure"  I did a colonoscopy for her August 2007 for family history of colon cancer.  Her father had colon cancer, probably diagnosed in his 49s.  She had diverticulosis but no polyps or cancers.  I recommended a repeat colonoscopy at 5-year interval.  She underwent a colonoscopy March 2017 at outside facility.  Diverticulosis was noted.  2 subcentimeter polyps were removed with forceps.  Pathology showed 1 of these was a tubular adenoma and the other was hyperplastic.  CT scan abdomen and pelvis with IV and oral contrast May 2020 showed colonic diverticulosis.  Possible diverticulitis.  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  Hemoglobin A1c October 2020 11.2     Review of systems: Pertinent positive and negative review of systems were noted in the above HPI section. All other review negative.   Past Medical History:  Diagnosis Date  . Asthma   . Cellulitis and abscess of unspecified site   . Digestive-genital tract fistula, female   . GERD (gastroesophageal reflux disease)   . Hyperlipidemia   . Hypertension   . Obesity, unspecified   . Seasonal allergies   . Type II or unspecified type diabetes mellitus without mention of complication, uncontrolled   . Vaginitis and vulvovaginitis, unspecified     Past Surgical History:  Procedure Laterality Date  . boils removed     pubic area  . CESAREAN SECTION     x2  . COLONOSCOPY  2017   [facility]  . ESOPHAGOGASTRODUODENOSCOPY      Current Outpatient Medications  Medication Sig Dispense Refill  . aspirin EC 81 MG tablet Take 81 mg by mouth daily.    . cetirizine (ZYRTEC) 10 MG tablet Take 10 mg by mouth daily.    . furosemide (LASIX) 40 MG tablet Take 40 mg by mouth daily.      Marland Kitchen glipiZIDE (GLUCOTROL) 10 MG tablet TAKE 1 TABLET BY MOUTH TWICE DAILY FOR DIABETES    . isosorbide mononitrate (IMDUR) 30 MG 24 hr tablet Take by mouth  daily.     . losartan (COZAAR) 50 MG tablet Take 50 mg by mouth daily.    . metFORMIN (GLUCOPHAGE) 1000 MG tablet Take 1,000 mg by mouth 2 (two) times daily with a meal.    . Semaglutide (OZEMPIC, 0.25 OR 0.5 MG/DOSE, Dwight) Inject into the skin once a week.     No current facility-administered medications for this visit.    Allergies as of 11/14/2019  . (No Known Allergies)    Family History  Problem Relation Age of Onset  . Colon cancer Father 84  . Heart disease Mother     Social History   Socioeconomic History  . Marital status: Divorced    Spouse name: Not on file  . Number of children: 2  . Years of education: Not on  file  . Highest education level: Not on file  Occupational History  . Occupation: receptionist  Tobacco Use  . Smoking status: Former Research scientist (life sciences)  . Smokeless tobacco: Never Used  Substance and Sexual Activity  . Alcohol use: Not Currently    Alcohol/week: 0.0 standard drinks    Comment: occasional; once monthly wine  . Drug use: No  . Sexual activity: Not on file  Other Topics Concern  . Not on file  Social History Narrative  . Not on file   Social Determinants of Health   Financial Resource Strain:   . Difficulty of Paying Living Expenses: Not on file  Food Insecurity:   . Worried About Charity fundraiser in the Last Year: Not on file  . Ran Out of Food in the Last Year: Not on file  Transportation Needs:   . Lack of Transportation (Medical): Not on file  . Lack of Transportation (Non-Medical): Not on file  Physical Activity:   . Days of Exercise per Week: Not on file  . Minutes of Exercise per Session: Not on file  Stress:   . Feeling of Stress : Not on file  Social Connections:   . Frequency of Communication with Friends and Family: Not on file  . Frequency of Social Gatherings with Friends and Family: Not on file  . Attends Religious Services: Not on file  . Active Member of Clubs or Organizations: Not on file  . Attends Archivist Meetings: Not on file  . Marital Status: Not on file  Intimate Partner Violence:   . Fear of Current or Ex-Partner: Not on file  . Emotionally Abused: Not on file  . Physically Abused: Not on file  . Sexually Abused: Not on file     Physical Exam: BP 130/70   Pulse 80   Temp 98.3 F (36.8 C)   Ht 5\' 2"  (1.575 m)   Wt 195 lb 6 oz (88.6 kg)   BMI 35.73 kg/m  Constitutional: generally well-appearing Psychiatric: alert and oriented x3 Eyes: extraocular movements intact Mouth: oral pharynx moist, no lesions Neck: supple no lymphadenopathy Cardiovascular: heart regular rate and rhythm Lungs: clear to auscultation  bilaterally Abdomen: soft, nontender, nondistended, no obvious ascites, no peritoneal signs, normal bowel sounds Extremities: no lower extremity edema bilaterally Skin: no lesions on visible extremities   Assessment and plan: 64 y.o. female with family history colon cancer, personal history of adenomatous colon polyps, right upper quadrant pains, indigestion/chest pressure  First she has a family history of colon cancer personal history of adenomatous colon polyps, we will arrange for repeat colonoscopy to be done in 2022 which is about 5 years from her last one.  Second she has intermittent right upper quadrant pains that might be biliary in origin.  I recommended that we will arrange an abdominal ultrasound to evaluate that further.  Third she has indigestion and chest pressures.  I think these might be related to her coronary artery disease that is evident on her failed stress test.  She is undergoing a angiogram next week at an outside facility.  She knows to call here if her symptoms are not improved after her angiogram and any further treatments to her heart.   Please see the "Patient Instructions" section for addition details about the plan.   Owens Loffler, MD [facility] 11/14/2019, 8:45 AM  Cc: Yong Channel*  Total time on date of encounter was 45 minutes  (this included time spent preparing to see the patient reviewing records; obtaining and/or reviewing separately obtained history; performing a medically appropriate exam and/or evaluation; counseling and educating the patient and family if present; ordering medications, tests or procedures if applicable; and documenting clinical information in the health record).

## 2019-11-14 NOTE — Patient Instructions (Addendum)
If you are age 64 or older, your body mass index should be between 23-30. Your Body mass index is 35.73 kg/m. If this is out of the aforementioned range listed, please consider follow up with your Primary Care Provider.  If you are age 44 or younger, your body mass index should be between 19-25. Your Body mass index is 35.73 kg/m. If this is out of the aformentioned range listed, please consider follow up with your Primary Care Provider.   You have been scheduled for an abdominal ultrasound at [facility] Radiology (1st floor of hospital) on 11/21/2019 at 9:30am. Please arrive 15 minutes prior to your appointment for registration. Make certain not to have anything to eat or drink after midnight the night prior to your appointment. Should you need to reschedule your appointment, please contact radiology at [phone]. This test typically takes about 30 minutes to perform.  Please call our office in 2 months after cardiac procedures if you are still having complaints of chest pain or pressure and indigestion.   You will be put in for a recall colonoscopy for July 2022.  Thank you,  Dr Ardis Hughs

## 2019-11-21 ENCOUNTER — Ambulatory Visit (HOSPITAL_COMMUNITY): Payer: 59

## 2019-11-28 ENCOUNTER — Other Ambulatory Visit: Payer: Self-pay

## 2019-11-28 ENCOUNTER — Ambulatory Visit (HOSPITAL_COMMUNITY)
Admission: RE | Admit: 2019-11-28 | Discharge: 2019-11-28 | Disposition: A | Discharge status: Home / Self Care | Payer: 59 | Source: Ambulatory Visit | Attending: Gastroenterology | Admitting: Gastroenterology

## 2019-11-28 DIAGNOSIS — R0789 Other chest pain: Secondary | ICD-10-CM | POA: Insufficient documentation

## 2019-11-28 DIAGNOSIS — R1011 Right upper quadrant pain: Secondary | ICD-10-CM | POA: Insufficient documentation

## 2020-01-09 ENCOUNTER — Ambulatory Visit: Payer: 59 | Admitting: Gastroenterology

## 2020-02-11 DIAGNOSIS — D259 Leiomyoma of uterus, unspecified: Secondary | ICD-10-CM | POA: Insufficient documentation

## 2020-02-11 DIAGNOSIS — I10 Essential (primary) hypertension: Secondary | ICD-10-CM | POA: Insufficient documentation

## 2020-02-11 DIAGNOSIS — J45909 Unspecified asthma, uncomplicated: Secondary | ICD-10-CM | POA: Insufficient documentation

## 2020-02-11 DIAGNOSIS — E7800 Pure hypercholesterolemia, unspecified: Secondary | ICD-10-CM | POA: Insufficient documentation

## 2020-02-13 ENCOUNTER — Encounter: Payer: Self-pay | Admitting: Gastroenterology

## 2020-02-13 ENCOUNTER — Ambulatory Visit: Payer: 59 | Admitting: Gastroenterology

## 2020-02-13 VITALS — BP 124/60 | HR 72 | Temp 97.8°F | Ht 62.0 in | Wt 191.5 lb

## 2020-02-13 DIAGNOSIS — K5900 Constipation, unspecified: Secondary | ICD-10-CM | POA: Diagnosis not present

## 2020-02-13 DIAGNOSIS — K219 Gastro-esophageal reflux disease without esophagitis: Secondary | ICD-10-CM

## 2020-02-13 MED ORDER — OMEPRAZOLE 40 MG PO CPDR: DELAYED_RELEASE_CAPSULE | ORAL | 11 refills | Status: DC

## 2020-02-13 MED ORDER — CITRUCEL PO POWD: 1.0000 | Freq: Every day | ORAL | Status: DC

## 2020-02-13 NOTE — Progress Notes (Signed)
Review of pertinent gastrointestinal problems: 1.  Adenomatous colon polyps, family history of colon cancer, her father had colon cancer in his 64s.  Colonoscopy August 2007 showed diverticulosis, no polyps or cancers.  Colonoscopy March 2017 at outside facility, 2 subcentimeter polyps were removed with forceps.  Biopsy showed one was a tubular adenoma and the other was hyperplastic.  Repeat colonoscopy should be around March 2022. 2.  Possible diverticulitis.  CT scan abdomen pelvis with IV and oral contrast May 2020 showed diverticulosis, "possible diverticulitis" 3.  Poorly controlled diabetes hemoglobin A1c October 2020 was 11.2.   HPI: This is a very pleasant 64 year old woman   I last saw her in January 2021.  She was having some indigestion, chest discomforts, right upper quadrant pains that were spasm-like.  Abdominal ultrasound February 2021 suggested a 4 mm gallbladder polyp but otherwise normal.  She had a cardiac angiogram 1 or 2 months ago as well and had clean coronaries.  She is still bothered by indigestion.  The right upper quadrant have improved.  She has pyrosis at times as well.  The indigestion and pyrosis is associate with bloating, it occurs 3 or 4 times a week.  Often after eating.  Can last hours.  She will take Alka-Seltzer or some type of over-the-counter remedy and that usually helps.  She is also bothered by chronic mild intermittent constipation.  No overt GI bleeding.   ROS: complete GI ROS as described in HPI, all other review negative.  Constitutional:  No unintentional weight loss   Past Medical History:  Diagnosis Date  . Asthma   . Cellulitis and abscess of unspecified site   . Digestive-genital tract fistula, female   . GERD (gastroesophageal reflux disease)   . Hyperlipidemia   . Hypertension   . Obesity, unspecified   . Seasonal allergies   . Type II or unspecified type diabetes mellitus without mention of complication, uncontrolled   .  Vaginitis and vulvovaginitis, unspecified     Past Surgical History:  Procedure Laterality Date  . boils removed     pubic area  . CESAREAN SECTION     x2  . COLONOSCOPY  2017   [facility]  . ESOPHAGOGASTRODUODENOSCOPY      Current Outpatient Medications  Medication Sig Dispense Refill  . aspirin EC 81 MG tablet Take 81 mg by mouth daily.    . cetirizine (ZYRTEC) 10 MG tablet Take 10 mg by mouth daily.    . furosemide (LASIX) 40 MG tablet Take 40 mg by mouth daily.      Marland Kitchen glipiZIDE (GLUCOTROL) 10 MG tablet TAKE 1 TABLET BY MOUTH TWICE DAILY FOR DIABETES    . isosorbide mononitrate (IMDUR) 30 MG 24 hr tablet Take by mouth daily.     Marland Kitchen losartan (COZAAR) 50 MG tablet Take 50 mg by mouth daily.    . metFORMIN (GLUCOPHAGE) 1000 MG tablet Take 1,000 mg by mouth 2 (two) times daily with a meal.    . Semaglutide (OZEMPIC, 0.25 OR 0.5 MG/DOSE, [place]) Inject into the skin once a week.     No current facility-administered medications for this visit.    Allergies as of 02/13/2020  . (No Known Allergies)    Family History  Problem Relation Age of Onset  . Colon cancer Father 6  . Heart disease Mother     Social History   Socioeconomic History  . Marital status: Divorced    Spouse name: Not on file  . Number of children:  2  . Years of education: Not on file  . Highest education level: Not on file  Occupational History  . Occupation: receptionist  Tobacco Use  . Smoking status: Former Research scientist (life sciences)  . Smokeless tobacco: Never Used  Substance and Sexual Activity  . Alcohol use: Not Currently    Alcohol/week: 0.0 standard drinks    Comment: occasional; once monthly wine  . Drug use: No  . Sexual activity: Not on file  Other Topics Concern  . Not on file  Social History Narrative  . Not on file   Social Determinants of Health   Financial Resource Strain:   . Difficulty of Paying Living Expenses:   Food Insecurity:   . Worried About Charity fundraiser in the Last Year:   .  Arboriculturist in the Last Year:   Transportation Needs:   . Film/video editor (Medical):   Marland Kitchen Lack of Transportation (Non-Medical):   Physical Activity:   . Days of Exercise per Week:   . Minutes of Exercise per Session:   Stress:   . Feeling of Stress :   Social Connections:   . Frequency of Communication with Friends and Family:   . Frequency of Social Gatherings with Friends and Family:   . Attends Religious Services:   . Active Member of Clubs or Organizations:   . Attends Archivist Meetings:   Marland Kitchen Marital Status:   Intimate Partner Violence:   . Fear of Current or Ex-Partner:   . Emotionally Abused:   Marland Kitchen Physically Abused:   . Sexually Abused:      Physical Exam: BP 124/60 (BP Location: Left Arm, Patient Position: Sitting, Cuff Size: Normal)   Pulse 72   Temp 97.8 F (36.6 C)   Ht 5\' 2"  (1.575 m) Comment: height measured without shoes  Wt 191 lb 8 oz (86.9 kg)   BMI 35.03 kg/m  Constitutional: generally well-appearing Psychiatric: alert and oriented x3 Abdomen: soft, nontender, nondistended, no obvious ascites, no peritoneal signs, normal bowel sounds No peripheral edema noted in lower extremities  Assessment and plan: 64 y.o. female with GERD, indigestion, constipation   First, she is giving a pretty good history for GERD without alarm symptoms.  She possibly has underlying gastroparesis that could contribute.  She is working on keeping her blood sugars under good control.  She is not on any antiacid medicines on a routine basis and so I recommended a trial of proton pump inhibitor omeprazole.  We will call in 40 mg pills 1 pill once daily shortly before her breakfast meal.  Constipation is mild, chronic, no overt GI bleeding.  She is had a colonoscopy about 4 years ago and that does not need to be repeated now.  I recommended a trial of over-the-counter fiber supplements Citrucel.  She will return to see me in 2 months and sooner if any  problems.  Please see the "Patient Instructions" section for addition details about the plan.  Owens Loffler, MD [facility] 02/13/2020, 8:38 AM   Total time on date of encounter was 30 minutes (this included time spent preparing to see the patient reviewing records; obtaining and/or reviewing separately obtained history; performing a medically appropriate exam and/or evaluation; counseling and educating the patient and family if present; ordering medications, tests or procedures if applicable; and documenting clinical information in the health record).

## 2020-02-13 NOTE — Patient Instructions (Addendum)
If you are age 64 or older, your body mass index should be between 23-30. Your Body mass index is 35.03 kg/m. If this is out of the aforementioned range listed, please consider follow up with your Primary Care Provider.  If you are age 40 or younger, your body mass index should be between 19-25. Your Body mass index is 35.03 kg/m. If this is out of the aformentioned range listed, please consider follow up with your Primary Care Provider.   Please start taking citrucel (orange flavored) powder fiber supplement.  This may cause some bloating at first but that usually goes away. Begin with a small spoonful and work your way up to a large, heaping spoonful daily over a week.  You may experience some bloating when you first start taking Citrucel.  We have sent the following medications to your pharmacy for you to pick up at your convenience:  START: omeprazole 40mg  take one capsule shortly before breakfast each morning.  You will follow up with our office on 03-31-20 at 8:50am.  Please arrive 10 minutes prior to your appointment.  Thank you for entrusting me with your care and choosing [facility].  Dr Ardis Hughs

## 2020-03-31 ENCOUNTER — Ambulatory Visit: Payer: 59 | Admitting: Gastroenterology

## 2020-06-14 ENCOUNTER — Ambulatory Visit: Payer: 59 | Admitting: Gastroenterology

## 2020-06-14 ENCOUNTER — Encounter: Payer: Self-pay | Admitting: Gastroenterology

## 2020-06-14 DIAGNOSIS — K219 Gastro-esophageal reflux disease without esophagitis: Secondary | ICD-10-CM

## 2020-06-14 DIAGNOSIS — R1011 Right upper quadrant pain: Secondary | ICD-10-CM

## 2020-06-14 MED ORDER — FAMOTIDINE 20 MG PO TABS: 20.0000 mg | ORAL_TABLET | ORAL | Status: AC | PRN

## 2020-06-14 NOTE — Progress Notes (Signed)
Review of pertinent gastrointestinal problems: 1.  Adenomatous colon polyps, family history of colon cancer, her father had colon cancer in his 79s.  Colonoscopy August 2007 showed diverticulosis, no polyps or cancers.  Colonoscopy March 2017 at outside facility, 2 subcentimeter polyps were removed with forceps.  Biopsy showed one was a tubular adenoma and the other was hyperplastic.  Repeat colonoscopy should be around March 2022.. 2.  Possible diverticulitis.  CT scan abdomen pelvis with IV and oral contrast May 2020 showed diverticulosis, "possible diverticulitis" 3.  Poorly controlled diabetes hemoglobin A1c October 2020 was 11.2.   HPI: This is a very pleasant 64 year old woman  I last saw her 4 months ago.  She seemed to have GERD without alarm symptoms.  Possibly underlying gastroparesis given her hemoglobin A1c most recently was 11.2.  She was not taking antiacid medicines on a routine basis.  She also has mild chronic constipation without overt bleeding.  Hemoglobin A1c July 2021 was 6.9, complete metabolic profile was essentially normal  Some improvement with GERD.  Taking omeprazole as prescribed, GERD breakthrough once weekly.  Once weekly she will have pyrosis.  This can occur anytime throughout the day.  Not necessarily in the middle the night or when she wakes up  BM daily, not completely relieved (BM daily).  Still with RUQ pains.  Has a polyp in GB. 5/10 weekly abd pain, no radiating.  This is her biggest bother apparently.   Her weight is down 4 pounds since last office visit here, same scale 4 months ago  ROS: complete GI ROS as described in HPI, all other review negative.  Constitutional:  No unintentional weight loss   Past Medical History:  Diagnosis Date  . Asthma   . Cellulitis and abscess of unspecified site   . Digestive-genital tract fistula, female   . GERD (gastroesophageal reflux disease)   . Hyperlipidemia   . Hypertension   . Obesity, unspecified    . Seasonal allergies   . Type II or unspecified type diabetes mellitus without mention of complication, uncontrolled   . Vaginitis and vulvovaginitis, unspecified     Past Surgical History:  Procedure Laterality Date  . boils removed     pubic area  . CESAREAN SECTION     x2  . COLONOSCOPY  2017   [facility]  . ESOPHAGOGASTRODUODENOSCOPY      Current Outpatient Medications  Medication Sig Dispense Refill  . aspirin EC 81 MG tablet Take 81 mg by mouth daily.    . cetirizine (ZYRTEC) 10 MG tablet Take 10 mg by mouth daily.    . furosemide (LASIX) 40 MG tablet Take 40 mg by mouth daily.      Marland Kitchen glipiZIDE (GLUCOTROL) 10 MG tablet TAKE 1 TABLET BY MOUTH TWICE DAILY FOR DIABETES    . isosorbide mononitrate (IMDUR) 30 MG 24 hr tablet Take by mouth daily.     Marland Kitchen losartan (COZAAR) 50 MG tablet Take 50 mg by mouth daily.    . metFORMIN (GLUCOPHAGE) 1000 MG tablet Take 1,000 mg by mouth 2 (two) times daily with a meal.    . methylcellulose (CITRUCEL) oral powder Take 1 packet by mouth daily.    Marland Kitchen omeprazole (PRILOSEC) 40 MG capsule Take one capsule shortly before breakfast each morning. 30 capsule 11  . Semaglutide (OZEMPIC, 0.25 OR 0.5 MG/DOSE, [place]) Inject into the skin once a week.     No current facility-administered medications for this visit.    Allergies as of 06/14/2020  . (  No Known Allergies)    Family History  Problem Relation Age of Onset  . Colon cancer Father 71  . Heart disease Mother     Social History   Socioeconomic History  . Marital status: Divorced    Spouse name: Not on file  . Number of children: 2  . Years of education: Not on file  . Highest education level: Not on file  Occupational History  . Occupation: receptionist  Tobacco Use  . Smoking status: Former Research scientist (life sciences)  . Smokeless tobacco: Never Used  Vaping Use  . Vaping Use: Never used  Substance and Sexual Activity  . Alcohol use: Not Currently    Alcohol/week: 0.0 standard drinks    Comment:  occasional; once monthly wine  . Drug use: No  . Sexual activity: Not on file  Other Topics Concern  . Not on file  Social History Narrative  . Not on file   Social Determinants of Health   Financial Resource Strain:   . Difficulty of Paying Living Expenses: Not on file  Food Insecurity:   . Worried About Charity fundraiser in the Last Year: Not on file  . Ran Out of Food in the Last Year: Not on file  Transportation Needs:   . Lack of Transportation (Medical): Not on file  . Lack of Transportation (Non-Medical): Not on file  Physical Activity:   . Days of Exercise per Week: Not on file  . Minutes of Exercise per Session: Not on file  Stress:   . Feeling of Stress : Not on file  Social Connections:   . Frequency of Communication with Friends and Family: Not on file  . Frequency of Social Gatherings with Friends and Family: Not on file  . Attends Religious Services: Not on file  . Active Member of Clubs or Organizations: Not on file  . Attends Archivist Meetings: Not on file  . Marital Status: Not on file  Intimate Partner Violence:   . Fear of Current or Ex-Partner: Not on file  . Emotionally Abused: Not on file  . Physically Abused: Not on file  . Sexually Abused: Not on file     Physical Exam: BP 120/62   Pulse 77   Ht 5\' 2"  (1.575 m)   Wt 187 lb (84.8 kg)   BMI 34.20 kg/m  Constitutional: generally well-appearing Psychiatric: alert and oriented x3 Abdomen: soft, very mild tenderness in mid epigastrium, nondistended, no obvious ascites, no peritoneal signs, normal bowel sounds No peripheral edema noted in lower extremities  Assessment and plan: 64 y.o. female with chronic GERD, chronic constipation, intermittent right upper quadrant pains  First I think her GERD symptoms are under generally very good control on proton pump inhibitor once daily.  I recommended she try H2 blocker on an as-needed basis for once weekly breakthrough classic GERD symptoms.   She really has no alarm symptoms.  Her constipation seems generally better since starting fiber supplements every day she will continue that.  Her right upper quadrant pains are of unclear etiology.  She does have a very small polyp in her gallbladder.  Generally at that size they are not causing symptoms however perhaps hers is.  I recommended upper endoscopy to exclude other potential causes and if that examination is negative then I would likely refer her to a surgeon to discuss with her her right upper quadrant pains and consider elective cholecystectomy.  Please see the "Patient Instructions" section for addition details about the plan.  Owens Loffler, MD [facility] 06/14/2020, 9:24 AM   Total time on date of encounter was 30 minutes (this included time spent preparing to see the patient reviewing records; obtaining and/or reviewing separately obtained history; performing a medically appropriate exam and/or evaluation; counseling and educating the patient and family if present; ordering medications, tests or procedures if applicable; and documenting clinical information in the health record).

## 2020-06-14 NOTE — Patient Instructions (Addendum)
If you are age 64 or older, your body mass index should be between 23-30. Your Body mass index is 34.2 kg/m. If this is out of the aforementioned range listed, please consider follow up with your Primary Care Provider.  If you are age 34 or younger, your body mass index should be between 19-25. Your Body mass index is 34.2 kg/m. If this is out of the aformentioned range listed, please consider follow up with your Primary Care Provider.   You have been scheduled for an endoscopy. Please follow written instructions given to you at your visit today. If you use inhalers (even only as needed), please bring them with you on the day of your procedure.  Due to recent changes in healthcare laws, you may see the results of your imaging and laboratory studies on MyChart before your provider has had a chance to review them.  We understand that in some cases there may be results that are confusing or concerning to you. Not all laboratory results come back in the same time frame and the provider may be waiting for multiple results in order to interpret others.  Please give [place] 48 hours in order for your provider to thoroughly review all the results before contacting the office for clarification of your results.    Please continue omeprazole.  Please purchase the following medications over the counter and take as directed:  START: Pepcid 20mg  one tablet as needed for GERD.  Thank you for entrusting me with your care and choosing [facility].  Dr Ardis Hughs

## 2020-06-29 ENCOUNTER — Ambulatory Visit (AMBULATORY_SURGERY_CENTER): Payer: 59 | Admitting: Gastroenterology

## 2020-06-29 ENCOUNTER — Other Ambulatory Visit: Payer: Self-pay

## 2020-06-29 ENCOUNTER — Encounter: Payer: Self-pay | Admitting: Gastroenterology

## 2020-06-29 VITALS — BP 129/71 | HR 55 | Temp 97.1°F | Resp 21 | Ht 62.0 in | Wt 187.0 lb

## 2020-06-29 DIAGNOSIS — K2970 Gastritis, unspecified, without bleeding: Secondary | ICD-10-CM | POA: Diagnosis present

## 2020-06-29 DIAGNOSIS — B9681 Helicobacter pylori [H. pylori] as the cause of diseases classified elsewhere: Secondary | ICD-10-CM

## 2020-06-29 DIAGNOSIS — K2950 Unspecified chronic gastritis without bleeding: Secondary | ICD-10-CM | POA: Diagnosis not present

## 2020-06-29 DIAGNOSIS — K219 Gastro-esophageal reflux disease without esophagitis: Secondary | ICD-10-CM

## 2020-06-29 DIAGNOSIS — K449 Diaphragmatic hernia without obstruction or gangrene: Secondary | ICD-10-CM

## 2020-06-29 MED ORDER — SODIUM CHLORIDE 0.9 % IV SOLN: 500.0000 mL | Freq: Once | INTRAVENOUS | Status: DC

## 2020-06-29 NOTE — Progress Notes (Signed)
History reviewed today 

## 2020-06-29 NOTE — Progress Notes (Signed)
Called to room to assist during endoscopic procedure.  Patient ID and intended procedure confirmed with present staff. Received instructions for my participation in the procedure from the performing physician.  

## 2020-06-29 NOTE — Op Note (Signed)
[facility] Patient Name: Jasmine Harris Procedure Date: 06/29/2020 9:14 AM MRN: [id number] Endoscopist: Milus Banister , MD Age: 64 Referring MD:  Date of Birth: December 19, 1955 Gender: Female Account #: [id number] Procedure:                Upper GI endoscopy Indications:              Abdominal pain in the right upper quadrant,                            intermittent, generally post prandial Medicines:                Monitored Anesthesia Care Procedure:                Pre-Anesthesia Assessment:                           - Prior to the procedure, a History and Physical                            was performed, and patient medications and                            allergies were reviewed. The patient's tolerance of                            previous anesthesia was also reviewed. The risks                            and benefits of the procedure and the sedation                            options and risks were discussed with the patient.                            All questions were answered, and informed consent                            was obtained. Prior Anticoagulants: The patient has                            taken no previous anticoagulant or antiplatelet                            agents. ASA Grade Assessment: III - A patient with                            severe systemic disease. After reviewing the risks                            and benefits, the patient was deemed in                            satisfactory condition to undergo the procedure.  After obtaining informed consent, the endoscope was                            passed under direct vision. Throughout the                            procedure, the patient's blood pressure, pulse, and                            oxygen saturations were monitored continuously. The                            Endoscope was introduced through the mouth, and                            advanced to the second  part of duodenum. The upper                            GI endoscopy was accomplished without difficulty.                            The patient tolerated the procedure well. Scope In: Scope Out: Findings:                 Minimal inflammation characterized by erythema was                            found in the gastric antrum. Biopsies were taken                            with a cold forceps for histology.                           A small hiatal hernia was present.                           The examination was otherwise normal. Complications:            No immediate complications. Estimated blood loss:                            None. Estimated Blood Loss:     Estimated blood loss: none. Impression:               - Gastritis. Biopsied.                           - Small hiatal hernia.                           - Examination otherwise normal. Recommendation:           - Patient has a contact number available for                            emergencies. The signs and symptoms of potential  delayed complications were discussed with the                            patient. Return to normal activities tomorrow.                            Written discharge instructions were provided to the                            patient.                           - Resume previous diet.                           - Continue present medications.                           - Await pathology results. If the biopsies are                            negative for H. pylori then will likely arrange                            surgical referral to discuss your gallbadder polyp,                            consider elective cholecystectomy. Milus Banister, MD 06/29/2020 9:34:53 AM This report has been signed electronically.

## 2020-06-29 NOTE — Patient Instructions (Signed)
Please read handouts provided. Continue present medications. Await pathology results.    YOU HAD AN ENDOSCOPIC PROCEDURE TODAY AT THE [facility]:   Refer to the procedure report that was given to you for any specific questions about what was found during the examination.  If the procedure report does not answer your questions, please call your gastroenterologist to clarify.  If you requested that your care partner not be given the details of your procedure findings, then the procedure report has been included in a sealed envelope for you to review at your convenience later.  YOU SHOULD EXPECT: Some feelings of bloating in the abdomen. Passage of more gas than usual.  Walking can help get rid of the air that was put into your GI tract during the procedure and reduce the bloating. If you had a lower endoscopy (such as a colonoscopy or flexible sigmoidoscopy) you may notice spotting of blood in your stool or on the toilet paper. If you underwent a bowel prep for your procedure, you may not have a normal bowel movement for a few days.  Please Note:  You might notice some irritation and congestion in your nose or some drainage.  This is from the oxygen used during your procedure.  There is no need for concern and it should clear up in a day or so.  SYMPTOMS TO REPORT IMMEDIATELY:    Following upper endoscopy (EGD)  Vomiting of blood or coffee ground material  New chest pain or pain under the shoulder blades  Painful or persistently difficult swallowing  New shortness of breath  Fever of 100F or higher  Black, tarry-looking stools  For urgent or emergent issues, a gastroenterologist can be reached at any hour by calling [phone]. Do not use MyChart messaging for urgent concerns.    DIET:  We do recommend a small meal at first, but then you may proceed to your regular diet.  Drink plenty of fluids but you should avoid alcoholic beverages for 24 hours.  ACTIVITY:  You  should plan to take it easy for the rest of today and you should NOT DRIVE or use heavy machinery until tomorrow (because of the sedation medicines used during the test).    FOLLOW UP: Our staff will call the number listed on your records 48-72 hours following your procedure to check on you and address any questions or concerns that you may have regarding the information given to you following your procedure. If we do not reach you, we will leave a message.  We will attempt to reach you two times.  During this call, we will ask if you have developed any symptoms of COVID 19. If you develop any symptoms (ie: fever, flu-like symptoms, shortness of breath, cough etc.) before then, please call [phone].  If you test positive for Covid 19 in the 2 weeks post procedure, please call and report this information to us.    If any biopsies were taken you will be contacted by phone or by letter within the next 1-3 weeks.  Please call us at [phone] if you have not heard about the biopsies in 3 weeks.    SIGNATURES/CONFIDENTIALITY: You and/or your care partner have signed paperwork which will be entered into your electronic medical record.  These signatures attest to the fact that that the information above on your After Visit Summary has been reviewed and is understood.  Full responsibility of the confidentiality of this discharge information lies with you and/or your care-partner. 

## 2020-06-29 NOTE — Progress Notes (Signed)
VS- Jasmine Harris 

## 2020-06-29 NOTE — Progress Notes (Signed)
pt tolerated well. VSS. awake and to recovery. Report given to RN.  

## 2020-07-01 ENCOUNTER — Telehealth: Payer: Self-pay

## 2020-07-01 NOTE — Telephone Encounter (Signed)
  Follow up Call-  Call back number 06/29/2020  Post procedure Call Back phone  # [phone]  Permission to leave phone message Yes  Some recent data might be hidden     Patient questions:  Do you have a fever, pain , or abdominal swelling? No. Pain Score  0 *  Have you tolerated food without any problems? Yes.    Have you been able to return to your normal activities? Yes.    Do you have any questions about your discharge instructions: Diet   No. Medications  No. Follow up visit  No.  Do you have questions or concerns about your Care? No.  Actions: * If pain score is 4 or above: No action needed, pain <4.  1. Have you developed a fever since your procedure? no  2.   Have you had an respiratory symptoms (SOB or cough) since your procedure? no  3.   Have you tested positive for COVID 19 since your procedure no  4.   Have you had any family members/close contacts diagnosed with the COVID 19 since your procedure?  no   If yes to any of these questions please route to Joylene John, RN and Joella Prince, RN

## 2020-07-01 NOTE — Telephone Encounter (Signed)
First attempt follow up call to pt, lm on vm 

## 2020-07-05 ENCOUNTER — Telehealth: Payer: Self-pay | Admitting: Gastroenterology

## 2020-07-05 ENCOUNTER — Other Ambulatory Visit: Payer: Self-pay

## 2020-07-05 MED ORDER — PYLERA 140-125-125 MG PO CAPS: 3.0000 | ORAL_CAPSULE | Freq: Three times a day (TID) | ORAL | 0 refills | Status: DC

## 2020-07-05 NOTE — Telephone Encounter (Signed)
See results note. 

## 2021-02-28 DIAGNOSIS — Z9889 Other specified postprocedural states: Secondary | ICD-10-CM | POA: Insufficient documentation

## 2021-02-28 DIAGNOSIS — K219 Gastro-esophageal reflux disease without esophagitis: Secondary | ICD-10-CM | POA: Insufficient documentation

## 2021-04-28 ENCOUNTER — Encounter: Payer: Self-pay | Admitting: Gastroenterology

## 2021-11-17 DIAGNOSIS — Z96642 Presence of left artificial hip joint: Secondary | ICD-10-CM | POA: Insufficient documentation

## 2021-11-17 HISTORY — PX: HIP ARTHROPLASTY: SHX981

## 2023-02-21 LAB — MICROALBUMIN / CREATININE URINE RATIO
Creatinine, Urine.: 38.36
Microalb, Ur: <0.3

## 2023-02-21 LAB — PROTEIN / CREATININE RATIO, URINE: Creatinine, Urine: 38

## 2023-02-21 LAB — ESTIMATED GFR: GFR, Est African American: 78

## 2023-04-12 ENCOUNTER — Ambulatory Visit (INDEPENDENT_AMBULATORY_CARE_PROVIDER_SITE_OTHER): Payer: Medicare HMO | Admitting: Family Medicine

## 2023-04-12 ENCOUNTER — Encounter: Payer: Self-pay | Admitting: Family Medicine

## 2023-04-12 ENCOUNTER — Other Ambulatory Visit: Payer: Self-pay | Admitting: Family Medicine

## 2023-04-12 VITALS — BP 137/85 | HR 85 | Temp 98.2°F | Resp 18 | Ht 62.0 in | Wt 179.9 lb

## 2023-04-12 DIAGNOSIS — K219 Gastro-esophageal reflux disease without esophagitis: Secondary | ICD-10-CM | POA: Diagnosis not present

## 2023-04-12 DIAGNOSIS — I10 Essential (primary) hypertension: Secondary | ICD-10-CM

## 2023-04-12 DIAGNOSIS — Z7689 Persons encountering health services in other specified circumstances: Secondary | ICD-10-CM | POA: Diagnosis not present

## 2023-04-12 DIAGNOSIS — Z794 Long term (current) use of insulin: Secondary | ICD-10-CM

## 2023-04-12 DIAGNOSIS — E119 Type 2 diabetes mellitus without complications: Secondary | ICD-10-CM

## 2023-04-12 DIAGNOSIS — Z96642 Presence of left artificial hip joint: Secondary | ICD-10-CM

## 2023-04-12 DIAGNOSIS — Z8619 Personal history of other infectious and parasitic diseases: Secondary | ICD-10-CM

## 2023-04-12 NOTE — Progress Notes (Signed)
New Patient Office Visit  Subjective    Patient ID: Jasmine Harris, female    DOB: 03/01/1956  Age: 67 y.o. MRN: [id number]  CC:  Chief Complaint  Patient presents with   Establish Care    Patient is here to establish care to discuss weight management, check BP meds , and wants a handicap placard    HPI Jasmine Harris presents to establish care  RUQ swelling Pt reports RUQ pain and swelling for the last year, intermittently. Occurs with anything she eats. Also having indigestion symptoms. Have been on Pepcid 20 ng daily and Prilosec 40mg .  Pt is up to date with colonoscopy. She still has her gallbladder. Per chart review, pt was given Pylera in 2021 for h pylori, pt never took this. She denies tobacco use, social drinker. Pt reports she has bloating and does belch.   Diabetes Pt is taking Metformin 1000mg  bid and Ozempic 1mg  a week.  Pt reports she will no longer be able to get this due to insurance. She was on Glipizide 10mg  also but her sugars were coming down too much. She is on Atorvastatin 20mg , Losartan 100mg  daily.   Hypertension Pt is taking Losartan 100mg , Aldactone 50mg , Lasix 40 mg and Imdur 60mg  daily for HTN. She asks if she can come off some of her medicines.   She is UTD with pap, mammogram and Colonoscopy.  Recent AWV was Feb 21 2023 Pt requests DMV placard due to continued left hip pain. She had hip replacement Jan 2023 and reports she is still having issues when walking.   Outpatient Encounter Medications as of 04/12/2023  Medication Sig   atorvastatin (LIPITOR) 20 MG tablet Take 20 mg by mouth daily.   cetirizine (ZYRTEC) 10 MG tablet Take 10 mg by mouth daily.   famotidine (PEPCID) 20 MG tablet Take 1 tablet (20 mg total) by mouth as needed for heartburn or indigestion.   isosorbide mononitrate (IMDUR) 60 MG 24 hr tablet Take 1 tablet by mouth every morning.   losartan (COZAAR) 100 MG tablet Take 100 mg by mouth daily.   metFORMIN (GLUCOPHAGE) 1000 MG tablet  Take 1,000 mg by mouth 2 (two) times daily with a meal.   omeprazole (PRILOSEC) 40 MG capsule Take one capsule shortly before breakfast each morning.   OZEMPIC, 1 MG/DOSE, 4 MG/3ML SOPN Inject 1 mg into the skin once a week.   spironolactone (ALDACTONE) 50 MG tablet Take 50 mg by mouth daily.   [DISCONTINUED] furosemide (LASIX) 40 MG tablet Take 40 mg by mouth daily.     aspirin EC 81 MG tablet Take 81 mg by mouth daily. (Patient not taking: Reported on 04/12/2023)   [DISCONTINUED] bismuth-metronidazole-tetracycline (PYLERA) 140-125-125 MG capsule Take 3 capsules by mouth 4 (four) times daily -  before meals and at bedtime for 10 days.   [DISCONTINUED] glipiZIDE (GLUCOTROL) 10 MG tablet TAKE 1 TABLET BY MOUTH TWICE DAILY FOR DIABETES (Patient not taking: Reported on 04/12/2023)   [DISCONTINUED] isosorbide mononitrate (IMDUR) 30 MG 24 hr tablet Take by mouth daily.    [DISCONTINUED] losartan (COZAAR) 50 MG tablet Take 50 mg by mouth daily.   [DISCONTINUED] methylcellulose (CITRUCEL) oral powder Take 1 packet by mouth daily. (Patient not taking: Reported on 04/12/2023)   [DISCONTINUED] Semaglutide (OZEMPIC, 0.25 OR 0.5 MG/DOSE, [place]) Inject into the skin once a week.   No facility-administered encounter medications on file as of 04/12/2023.    Past Medical History:  Diagnosis Date   Asthma  Cellulitis and abscess of unspecified site    Digestive-genital tract fistula, female    GERD (gastroesophageal reflux disease)    Hyperlipidemia    Hypertension    Obesity, unspecified    Seasonal allergies    Type II or unspecified type diabetes mellitus without mention of complication, uncontrolled    Vaginitis and vulvovaginitis, unspecified     Past Surgical History:  Procedure Laterality Date   boils removed     pubic area   CESAREAN SECTION     x2   COLONOSCOPY  2017   [facility]   ESOPHAGOGASTRODUODENOSCOPY     HIP ARTHROPLASTY Left 11/17/2021    Family History  Problem Relation Age  of Onset   Colon cancer Father 66   Heart disease Mother    Esophageal cancer Neg Hx    Rectal cancer Neg Hx    Stomach cancer Neg Hx     Social History   Socioeconomic History   Marital status: Divorced    Spouse name: Not on file   Number of children: 2   Years of education: Not on file   Highest education level: Not on file  Occupational History   Occupation: receptionist  Tobacco Use   Smoking status: Former    Passive exposure: Past   Smokeless tobacco: Never  Building services engineer Use: Never used  Substance and Sexual Activity   Alcohol use: Yes    Comment: occasional; once monthly wine   Drug use: Never   Sexual activity: Not on file  Other Topics Concern   Not on file  Social History Narrative   Not on file   Social Determinants of Health   Financial Resource Strain: Not on file  Food Insecurity: Not on file  Transportation Needs: Not on file  Physical Activity: Not on file  Stress: Not on file  Social Connections: Not on file  Intimate Partner Violence: Not on file    Review of Systems  Gastrointestinal:  Positive for heartburn. Negative for blood in stool, constipation, diarrhea, melena, nausea and vomiting.       Bloating  Musculoskeletal:  Positive for joint pain.       Left hip pain  All other systems reviewed and are negative.       Objective    BP 137/85   Pulse 85   Temp 98.2 F (36.8 C) (Oral)   Resp 18   Ht 5\' 2"  (1.575 m)   Wt 179 lb 14.4 oz (81.6 kg)   SpO2 100%   BMI 32.90 kg/m   Physical Exam Vitals and nursing note reviewed.  Constitutional:      Appearance: Normal appearance. She is normal weight.  HENT:     Head: Normocephalic and atraumatic.     Right Ear: External ear normal.     Left Ear: External ear normal.     Nose: Nose normal.     Mouth/Throat:     Mouth: Mucous membranes are moist.  Eyes:     Conjunctiva/sclera: Conjunctivae normal.     Pupils: Pupils are equal, round, and reactive to light.   Cardiovascular:     Rate and Rhythm: Normal rate and regular rhythm.     Pulses: Normal pulses.     Heart sounds: Normal heart sounds.  Pulmonary:     Effort: Pulmonary effort is normal.     Breath sounds: Normal breath sounds.  Abdominal:     General: Abdomen is flat. Bowel sounds are normal.  Skin:    General: Skin is warm.     Capillary Refill: Capillary refill takes less than 2 seconds.  Neurological:     General: No focal deficit present.     Mental Status: She is alert and oriented to person, place, and time. Mental status is at baseline.  Psychiatric:        Mood and Affect: Mood normal.        Behavior: Behavior normal.        Thought Content: Thought content normal.        Judgment: Judgment normal.        Assessment & Plan:   Problem List Items Addressed This Visit       Cardiovascular and Mediastinum   Essential hypertension   Relevant Medications   losartan (COZAAR) 100 MG tablet   isosorbide mononitrate (IMDUR) 60 MG 24 hr tablet   spironolactone (ALDACTONE) 50 MG tablet   atorvastatin (LIPITOR) 20 MG tablet     Digestive   GERD (gastroesophageal reflux disease)     Endocrine   Controlled type 2 diabetes mellitus without complication, with long-term current use of insulin (HCC)   Relevant Medications   OZEMPIC, 1 MG/DOSE, 4 MG/3ML SOPN   losartan (COZAAR) 100 MG tablet   atorvastatin (LIPITOR) 20 MG tablet     Other   S/P total left hip arthroplasty   Other Visit Diagnoses     Encounter to establish care with new doctor    -  Primary   History of Helicobacter pylori infection       Relevant Orders   H. pylori breath test      Encounter to establish care with new doctor  Essential hypertension  Controlled type 2 diabetes mellitus without complication, with long-term current use of insulin (HCC)   Reviewed pt's previous records from previous PCP. Abstracting personally labs done from Feb 21, 2023 along with health maintenance items.  Have  continued all medication regimen for HTN and DM except may come off Lasix for now. Stay on Losartan 100 mg, Imdur 60 mg, and Aldactone 50 mg daily. Monitor blood pressures outside the office. BP log given today. Will recheck in 3 months.   Gastroesophageal reflux disease without esophagitis  History of Helicobacter pylori infection -     H. pylori breath test  RUQ swelling and bloating may be from untreated H pylori. Reviewed medicines with pt and she reports never picking up the Pylera that was sent in for her. Will re-screen for this infection. If negative, may send for GB ultrasound for evaluation. S/P total left hip arthroplasty  Have advised pt to follow back up with Ortho for left hip pain. DMV form completed today for 6 months only.  Return in about 3 months (around 07/13/2023) for Diabetes, Hypertension.   Suzan Slick, MD Total time spent with patient today 51 minutes. This includes reviewing records, evaluating the patient and coordinating care. Face-to-face time >50%.

## 2023-04-14 LAB — H. PYLORI BREATH TEST: H pylori Breath Test: POSITIVE — AB

## 2023-04-16 ENCOUNTER — Encounter: Payer: Self-pay | Admitting: Family Medicine

## 2023-04-16 ENCOUNTER — Other Ambulatory Visit: Payer: Self-pay | Admitting: Family Medicine

## 2023-04-16 DIAGNOSIS — A048 Other specified bacterial intestinal infections: Secondary | ICD-10-CM

## 2023-04-16 MED ORDER — CLARITHROMYCIN 500 MG PO TABS
500.0000 mg | ORAL_TABLET | Freq: Two times a day (BID) | ORAL | 0 refills | Status: AC
Start: 2023-04-16 — End: 2023-04-23

## 2023-04-16 MED ORDER — METRONIDAZOLE 500 MG PO TABS
500.0000 mg | ORAL_TABLET | Freq: Two times a day (BID) | ORAL | 0 refills | Status: AC
Start: 2023-04-16 — End: 2023-04-23

## 2023-04-16 MED ORDER — OMEPRAZOLE 20 MG PO CPDR
20.0000 mg | DELAYED_RELEASE_CAPSULE | Freq: Two times a day (BID) | ORAL | 0 refills | Status: DC
Start: 2023-04-16 — End: 2023-08-01

## 2023-06-15 ENCOUNTER — Ambulatory Visit (INDEPENDENT_AMBULATORY_CARE_PROVIDER_SITE_OTHER): Payer: Medicare HMO | Admitting: Family Medicine

## 2023-06-15 ENCOUNTER — Encounter: Payer: Self-pay | Admitting: Family Medicine

## 2023-06-15 VITALS — BP 105/61 | HR 64 | Temp 97.8°F | Resp 18 | Ht 62.0 in | Wt 180.6 lb

## 2023-06-15 DIAGNOSIS — A048 Other specified bacterial intestinal infections: Secondary | ICD-10-CM

## 2023-06-15 DIAGNOSIS — R1011 Right upper quadrant pain: Secondary | ICD-10-CM | POA: Diagnosis not present

## 2023-06-15 NOTE — Progress Notes (Signed)
   Established Patient Office Visit  Subjective   Patient ID: Jasmine Harris, female    DOB: 08-27-1956  Age: 67 y.o. MRN: [id number]  No chief complaint on file.   HPI  RUQ pain Pt reports a chronic hx of RUQ pain and indigestion for months. She had hx of H pylori infection diagnosed a while ago but was unable to pay for the Prevpack. She reported this to me when she established care in the office. She was retested for H pylori and was positive. She was sent in treatment including Clarithromycin, Flagyl, and Omeprazole. Pt reports she completed this treatment 2 months ago. She is still having symptoms. She is here today with continued RUQ pain and discomfort, worse after eating. She still has her gallbladder. Denies diarrhea, nausea or vomiting.   Review of Systems  Gastrointestinal:  Positive for abdominal pain. Negative for blood in stool, constipation, diarrhea, heartburn, melena, nausea and vomiting.  All other systems reviewed and are negative.    Objective:     There were no vitals taken for this visit.   Physical Exam Vitals and nursing note reviewed.  Constitutional:      Appearance: Normal appearance. She is normal weight.  HENT:     Head: Normocephalic and atraumatic.     Right Ear: External ear normal.     Left Ear: External ear normal.     Nose: Nose normal.     Mouth/Throat:     Mouth: Mucous membranes are moist.     Pharynx: Oropharynx is clear.  Eyes:     Conjunctiva/sclera: Conjunctivae normal.     Pupils: Pupils are equal, round, and reactive to light.  Cardiovascular:     Rate and Rhythm: Normal rate and regular rhythm.     Pulses: Normal pulses.     Heart sounds: Normal heart sounds.  Pulmonary:     Effort: Pulmonary effort is normal.     Breath sounds: Normal breath sounds.  Abdominal:     General: Abdomen is flat. Bowel sounds are normal.     Tenderness: There is abdominal tenderness. There is guarding.  Skin:    General: Skin is warm.      Capillary Refill: Capillary refill takes less than 2 seconds.  Neurological:     General: No focal deficit present.     Mental Status: She is alert and oriented to person, place, and time. Mental status is at baseline.  Psychiatric:        Mood and Affect: Mood normal.        Behavior: Behavior normal.        Thought Content: Thought content normal.        Judgment: Judgment normal.    No results found for any visits on 06/15/23.    The 10-year ASCVD risk score (Arnett DK, et al., 2019) is: 26.5%    Assessment & Plan:   Problem List Items Addressed This Visit   None RUQ pain -     H. pylori breath test -     US Abdomen Limited; Future  H. pylori infection -     H. pylori breath test   To recheck H pylori to make sure resolution of infection. With continued RUQ Pain, will also proceed with Ultrasound to rule out gallbladder disease Follow up pending results.  No follow-ups on file.    Suzan Slick, MD

## 2023-06-19 LAB — H. PYLORI BREATH TEST: H pylori Breath Test: NEGATIVE

## 2023-06-22 ENCOUNTER — Ambulatory Visit (INDEPENDENT_AMBULATORY_CARE_PROVIDER_SITE_OTHER): Payer: Medicare HMO

## 2023-06-22 DIAGNOSIS — R1011 Right upper quadrant pain: Secondary | ICD-10-CM | POA: Diagnosis not present

## 2023-06-26 ENCOUNTER — Ambulatory Visit (INDEPENDENT_AMBULATORY_CARE_PROVIDER_SITE_OTHER): Payer: Medicare HMO | Admitting: Family Medicine

## 2023-06-26 ENCOUNTER — Encounter: Payer: Self-pay | Admitting: Family Medicine

## 2023-06-26 VITALS — BP 132/63 | HR 75 | Temp 98.4°F | Resp 18 | Ht 62.0 in | Wt 182.8 lb

## 2023-06-26 DIAGNOSIS — R932 Abnormal findings on diagnostic imaging of liver and biliary tract: Secondary | ICD-10-CM

## 2023-06-26 DIAGNOSIS — R1011 Right upper quadrant pain: Secondary | ICD-10-CM | POA: Diagnosis not present

## 2023-06-26 DIAGNOSIS — K838 Other specified diseases of biliary tract: Secondary | ICD-10-CM

## 2023-06-26 DIAGNOSIS — J069 Acute upper respiratory infection, unspecified: Secondary | ICD-10-CM | POA: Diagnosis not present

## 2023-06-26 MED ORDER — METHYLPREDNISOLONE 4 MG PO TBPK
ORAL_TABLET | ORAL | 0 refills | Status: DC
Start: 2023-06-26 — End: 2023-07-13

## 2023-06-26 MED ORDER — BENZONATATE 200 MG PO CAPS
200.0000 mg | ORAL_CAPSULE | Freq: Two times a day (BID) | ORAL | 0 refills | Status: DC | PRN
Start: 2023-06-26 — End: 2023-07-13

## 2023-06-26 NOTE — Progress Notes (Signed)
Acute Office Visit  Subjective:     Patient ID: Jasmine Harris, female    DOB: Apr 04, 1956, 67 y.o.   MRN: [id number]  Chief Complaint  Patient presents with   Nasal Congestion    Patient states that she is having bad congestion that started yesterday with head and chest congestion, she states that she is now experiencing some nasal drainage    HPI Patient is in today for acute visit. Pt reports chest congestion that started yesterday. She does have some sneezing and nasal drainage. She also has some wheezing. She denies sick contacts. Still eating well. Denies fever.  She also asks about her recent ultrasound that was done. She was having re-occurring RUQ pain. She was treated for H pylori and retested after completion of treatment which was negative. She had ultrasound done that showed gallbladder polyp but a dilated common bile duct. Recommended MRCP/ERCP.    Review of Systems  Constitutional:  Negative for chills and fever.  HENT:  Positive for congestion.   Respiratory:  Positive for cough and wheezing. Negative for sputum production and shortness of breath.   Cardiovascular:  Negative for chest pain.  Gastrointestinal:  Positive for abdominal pain.  All other systems reviewed and are negative.      Objective:    BP 132/63   Pulse 75   Temp 98.4 F (36.9 C) (Oral)   Resp 18   Ht 5\' 2"  (1.575 m)   Wt 182 lb 12.8 oz (82.9 kg)   SpO2 97%   BMI 33.43 kg/m  BP Readings from Last 3 Encounters:  06/26/23 132/63  06/15/23 105/61  04/12/23 137/85      Physical Exam Vitals and nursing note reviewed.  Constitutional:      Appearance: Normal appearance. She is normal weight.  HENT:     Head: Normocephalic and atraumatic.     Right Ear: Tympanic membrane, ear canal and external ear normal.     Left Ear: Tympanic membrane, ear canal and external ear normal.     Nose: Nose normal.     Mouth/Throat:     Mouth: Mucous membranes are moist.     Pharynx: Oropharynx is  clear.  Eyes:     Conjunctiva/sclera: Conjunctivae normal.     Pupils: Pupils are equal, round, and reactive to light.  Cardiovascular:     Rate and Rhythm: Normal rate and regular rhythm.     Pulses: Normal pulses.     Heart sounds: Normal heart sounds.  Pulmonary:     Effort: Pulmonary effort is normal.     Breath sounds: Wheezing present.  Abdominal:     General: Abdomen is flat. Bowel sounds are normal.  Skin:    General: Skin is warm.     Capillary Refill: Capillary refill takes less than 2 seconds.  Neurological:     General: No focal deficit present.     Mental Status: She is alert and oriented to person, place, and time. Mental status is at baseline.  Psychiatric:        Mood and Affect: Mood normal.        Behavior: Behavior normal.        Thought Content: Thought content normal.        Judgment: Judgment normal.   No results found for any visits on 06/26/23.      Assessment & Plan:   Problem List Items Addressed This Visit   None  Viral upper respiratory tract infection -  methylPREDNISolone; 6-day pack as directed  Dispense: 21 tablet; Refill: 0 -     Benzonatate; Take 1 capsule (200 mg total) by mouth 2 (two) times daily as needed for cough.  Dispense: 14 capsule; Refill: 0  RUQ pain -     Ambulatory referral to Gastroenterology  Abnormal ultrasound of biliary tract -     Ambulatory referral to Gastroenterology  Dilated bile duct -     Ambulatory referral to Gastroenterology   Pt with symptoms consistent with URI. Treat with medrol dose pack due to wheezing along with Tessalon perles to use prn for cough. Refer to GI for further evaluation of RUQ pain and dilated bile duct seen on ultrasound.   No orders of the defined types were placed in this encounter.   No follow-ups on file.  Suzan Slick, MD

## 2023-06-29 ENCOUNTER — Encounter: Payer: Self-pay | Admitting: Gastroenterology

## 2023-07-13 ENCOUNTER — Ambulatory Visit (INDEPENDENT_AMBULATORY_CARE_PROVIDER_SITE_OTHER): Payer: Medicare HMO | Admitting: Family Medicine

## 2023-07-13 ENCOUNTER — Encounter: Payer: Self-pay | Admitting: Family Medicine

## 2023-07-13 VITALS — BP 128/70 | HR 65 | Temp 98.4°F | Resp 18 | Ht 62.0 in | Wt 189.0 lb

## 2023-07-13 DIAGNOSIS — I10 Essential (primary) hypertension: Secondary | ICD-10-CM

## 2023-07-13 DIAGNOSIS — E119 Type 2 diabetes mellitus without complications: Secondary | ICD-10-CM

## 2023-07-13 DIAGNOSIS — Z794 Long term (current) use of insulin: Secondary | ICD-10-CM

## 2023-07-13 DIAGNOSIS — Z6834 Body mass index (BMI) 34.0-34.9, adult: Secondary | ICD-10-CM

## 2023-07-13 DIAGNOSIS — J4531 Mild persistent asthma with (acute) exacerbation: Secondary | ICD-10-CM

## 2023-07-13 MED ORDER — FLUTICASONE-SALMETEROL 100-50 MCG/ACT IN AEPB
1.0000 | INHALATION_SPRAY | Freq: Two times a day (BID) | RESPIRATORY_TRACT | 3 refills | Status: DC
Start: 2023-07-13 — End: 2023-12-26

## 2023-07-13 MED ORDER — MONTELUKAST SODIUM 10 MG PO TABS
10.0000 mg | ORAL_TABLET | Freq: Every day | ORAL | 3 refills | Status: DC
Start: 2023-07-13 — End: 2023-10-29

## 2023-07-13 MED ORDER — ALBUTEROL SULFATE HFA 108 (90 BASE) MCG/ACT IN AERS
2.0000 | INHALATION_SPRAY | Freq: Four times a day (QID) | RESPIRATORY_TRACT | 0 refills | Status: DC | PRN
Start: 2023-07-13 — End: 2023-11-05

## 2023-07-13 NOTE — Progress Notes (Signed)
Established Patient Office Visit  Subjective   Patient ID: Jasmine Harris, female    DOB: 24-Jan-1956  Age: 67 y.o. MRN: [id number]  Chief Complaint  Patient presents with   Medical Management of Chronic Issues    Patient is here for a 3 month follow up for HTN and DM,     chest congestion    Patient states that she is still having a lot of congestion, she state that she took the medication that was given to her but still isn't feeling well    HPI  Diabetes Checking sugars at home. Running 120-200s. She is taking Metformin 1000mg  BID, Atorvastatin 10mg  daily and Losartan 100mg  daily. She is no longer taking Ozempic due to insurance coverage. Due to CMP, A1c, and urine micro today. Pt states she is starting to gain weight again and asks about options.   Hypertension Pt is taking Imdur 60 mg daily, Losartan 100mg  Daily, and Spironolactone 50mg  daily. Blood pressure at goal today.  Cough Pt reports continued cough and wheezing. Given Medrol dose pack and Tessalon perles 3 weeks ago and reports it didn't help. She has been diagnosed with asthma and noted in her chart. Pt isn't on any inhalers. Denies hemoptysis or SOB. No smoke exposure.    Review of Systems  Respiratory:  Positive for cough, sputum production and wheezing. Negative for hemoptysis and shortness of breath.   Cardiovascular:  Negative for chest pain.  All other systems reviewed and are negative.    Objective:     BP 128/70   Pulse 65   Temp 98.4 F (36.9 C) (Oral)   Resp 18   Ht 5\' 2"  (1.575 m)   Wt 189 lb (85.7 kg)   SpO2 100%   BMI 34.57 kg/m  BP Readings from Last 3 Encounters:  07/13/23 128/70  06/26/23 132/63  06/15/23 105/61      Physical Exam Vitals and nursing note reviewed.  Constitutional:      Appearance: Normal appearance. She is normal weight.  HENT:     Head: Normocephalic and atraumatic.     Right Ear: External ear normal.     Left Ear: External ear normal.     Nose: Nose normal.      Mouth/Throat:     Mouth: Mucous membranes are moist.     Pharynx: Oropharynx is clear.  Eyes:     Conjunctiva/sclera: Conjunctivae normal.     Pupils: Pupils are equal, round, and reactive to light.  Cardiovascular:     Rate and Rhythm: Normal rate and regular rhythm.     Pulses: Normal pulses.     Heart sounds: Normal heart sounds.  Pulmonary:     Effort: Pulmonary effort is normal. No respiratory distress.     Breath sounds: No stridor. Wheezing present. No rhonchi or rales.  Chest:     Chest wall: No tenderness.  Skin:    General: Skin is warm.     Capillary Refill: Capillary refill takes less than 2 seconds.  Neurological:     General: No focal deficit present.     Mental Status: She is alert and oriented to person, place, and time. Mental status is at baseline.  Psychiatric:        Mood and Affect: Mood normal.        Behavior: Behavior normal.        Thought Content: Thought content normal.        Judgment: Judgment normal.    Results for  orders placed or performed in visit on 07/13/23  Protein / creatinine ratio, urine  Result Value Ref Range   Creatinine, Urine 38     Last hemoglobin A1c Lab Results  Component Value Date   HGBA1C 7.4 (A) 12/23/2012      The 10-year ASCVD risk score (Arnett DK, et al., 2019) is: 23.2%    Assessment & Plan:   Problem List Items Addressed This Visit   None Essential hypertension -     Comprehensive metabolic panel  Controlled type 2 diabetes mellitus without complication, with long-term current use of insulin (HCC) -     Hemoglobin A1c -     Microalbumin / creatinine urine ratio  Mild persistent asthma with acute exacerbation -     Albuterol Sulfate HFA; Inhale 2 puffs into the lungs every 6 (six) hours as needed for wheezing or shortness of breath.  Dispense: 8 g; Refill: 0 -     Montelukast Sodium; Take 1 tablet (10 mg total) by mouth at bedtime.  Dispense: 30 tablet; Refill: 3 -     Fluticasone-Salmeterol; Inhale  1 puff into the lungs 2 (two) times daily.  Dispense: 1 each; Refill: 3  BMI 34.0-34.9,adult   Blood pressure at goal. Continue regimen and recheck CMP today (Losartan 100mg , Imdur 60 mg daily and Spironolactone 50mg  daily) Will check A1c for diabetes along with urine micro. Continue Metformin 1000mg  BID for now. Counseled on exercise and diet changes. Increase protein and handout given. Also advise for daily walking. Will see back in 4 weeks for weight management.  Pt has lingering cough and wheezing with hx of asthma. This is likely asthma exacerbation. She hasn't been on any maintenance treatment. Will add Advair 100/50mg  2 puffs inhaled BID along with Albuterol inhaler prn for cough/SOB/wheezing. Also will be added on Singulair 10mg  at bedtime. Follow up in 4 weeks.   No follow-ups on file.    Suzan Slick, MD

## 2023-07-15 LAB — MICROALBUMIN / CREATININE URINE RATIO
Creatinine, Urine: 18.2 mg/dL
Microalb/Creat Ratio: <16 mg/g creat (ref 0–29)
Microalbumin, Urine: <3 ug/mL

## 2023-07-15 LAB — COMPREHENSIVE METABOLIC PANEL
ALT: 31 IU/L (ref 0–32)
AST: 37 IU/L (ref 0–40)
Albumin: 4.5 g/dL (ref 3.9–4.9)
Alkaline Phosphatase: 123 IU/L — ABNORMAL HIGH (ref 44–121)
BUN/Creatinine Ratio: 6 — ABNORMAL LOW (ref 12–28)
BUN: 5 mg/dL — ABNORMAL LOW (ref 8–27)
Bilirubin Total: 0.3 mg/dL (ref 0.0–1.2)
CO2: 23 mmol/L (ref 20–29)
Calcium: 10 mg/dL (ref 8.7–10.3)
Chloride: 100 mmol/L (ref 96–106)
Creatinine, Ser: 0.78 mg/dL (ref 0.57–1.00)
Globulin, Total: 2.4 g/dL (ref 1.5–4.5)
Glucose: 176 mg/dL — ABNORMAL HIGH (ref 70–99)
Potassium: 4.6 mmol/L (ref 3.5–5.2)
Sodium: 141 mmol/L (ref 134–144)
Total Protein: 6.9 g/dL (ref 6.0–8.5)
eGFR: 83 mL/min/{1.73_m2} (ref >59)

## 2023-07-15 LAB — HEMOGLOBIN A1C
Est. average glucose Bld gHb Est-mCnc: 171 mg/dL
Hgb A1c MFr Bld: 7.6 % — ABNORMAL HIGH (ref 4.8–5.6)

## 2023-07-16 ENCOUNTER — Other Ambulatory Visit: Payer: Self-pay | Admitting: Family Medicine

## 2023-07-16 DIAGNOSIS — Z6834 Body mass index (BMI) 34.0-34.9, adult: Secondary | ICD-10-CM

## 2023-07-16 DIAGNOSIS — E1165 Type 2 diabetes mellitus with hyperglycemia: Secondary | ICD-10-CM

## 2023-07-16 MED ORDER — SEMAGLUTIDE(0.25 OR 0.5MG/DOS) 2 MG/3ML ~~LOC~~ SOPN: 0.2500 mg | PEN_INJECTOR | SUBCUTANEOUS | 1 refills | Status: DC

## 2023-08-01 ENCOUNTER — Encounter: Payer: Self-pay | Admitting: Gastroenterology

## 2023-08-01 ENCOUNTER — Ambulatory Visit: Payer: Medicare HMO | Admitting: Gastroenterology

## 2023-08-01 VITALS — BP 130/66 | Ht 62.0 in | Wt 183.0 lb

## 2023-08-01 DIAGNOSIS — R932 Abnormal findings on diagnostic imaging of liver and biliary tract: Secondary | ICD-10-CM | POA: Diagnosis not present

## 2023-08-01 DIAGNOSIS — K219 Gastro-esophageal reflux disease without esophagitis: Secondary | ICD-10-CM | POA: Diagnosis not present

## 2023-08-01 DIAGNOSIS — Z8619 Personal history of other infectious and parasitic diseases: Secondary | ICD-10-CM | POA: Diagnosis not present

## 2023-08-01 DIAGNOSIS — R1011 Right upper quadrant pain: Secondary | ICD-10-CM | POA: Diagnosis not present

## 2023-08-01 DIAGNOSIS — Z860100 Personal history of colon polyps, unspecified: Secondary | ICD-10-CM

## 2023-08-01 MED ORDER — NA SULFATE-K SULFATE-MG SULF 17.5-3.13-1.6 GM/177ML PO SOLN: 1.0000 | Freq: Once | ORAL | 0 refills | Status: AC

## 2023-08-01 MED ORDER — PANTOPRAZOLE SODIUM 40 MG PO TBEC: 40.0000 mg | DELAYED_RELEASE_TABLET | Freq: Every day | ORAL | 3 refills | Status: DC

## 2023-08-01 NOTE — Patient Instructions (Addendum)
You have been scheduled for a colonoscopy. Please follow written instructions given to you at your visit today.   Please pick up your prep supplies at the pharmacy within the next 1-3 days.  If you use inhalers (even only as needed), please bring them with you on the day of your procedure.  DO NOT TAKE 7 DAYS PRIOR TO TEST- Trulicity (dulaglutide) Ozempic, Wegovy (semaglutide) Mounjaro (tirzepatide) Bydureon Bcise (exanatide extended release)  DO NOT TAKE 1 DAY PRIOR TO YOUR TEST Rybelsus (semaglutide) Adlyxin (lixisenatide) Victoza (liraglutide) Byetta (exanatide) ___________________________________________________________________________  Bonita Quin will be contacted by [facility] Scheduling in the next 2 days to arrange an MRCP.  The number on your caller ID will be [phone], please answer when they call.  If you have not heard from them in 2 days please call [phone] to schedule.   Stop Prilosec.    We have sent the following medications to your pharmacy for you to pick up at your convenience: Pantoprazole 40 mg: Take once daily  Your provider has ordered "[employer]" stool testing for you. You have received a kit from our office today containing all necessary supplies to complete this test. Please carefully read the stool collection instructions provided in the kit before opening the accompanying materials. In addition, be sure to place the label from the top left corner of the laboratory request sheet onto the "puritan opti-swab" tube that is supplied in the kit. This label should include your full name and date of birth. After completing the test, you should secure the purtian tube into the specimen biohazard bag. The laboratory request information sheet (including date and time of specimen collection) should be placed into the outside pocket of the specimen biohazard bag and returned to the [place] lab with 2 days of collection.    Thank you for entrusting me with your  care and for choosing [facility], Dr. Ileene Patrick

## 2023-08-01 NOTE — Progress Notes (Signed)
HPI :  67 year old female with a history of colon polyps, abdominal pain, diabetes, here to reestablish her care with our office.  She has previously been followed by Dr. Christella Hartigan, last seen in August 2021.  Review of pertinent gastrointestinal problems: 1.  Adenomatous colon polyps, family history of colon cancer, her father had colon cancer in his 9s.  Colonoscopy August 2007 showed diverticulosis, no polyps or cancers.  Colonoscopy March 2017 at outside facility, 2 subcentimeter polyps were removed with forceps.  Biopsy showed one was a tubular adenoma and the other was hyperplastic.  Repeat colonoscopy should be around March 2022.. 2.  Possible diverticulitis.  CT scan abdomen pelvis with IV and oral contrast May 2020 showed diverticulosis, "possible diverticulitis"   This is my first time seeing her in the office.  One of her main complaints is ongoing right upper quadrant discomfort that has been going on for a few years or so at least.  She states it tends to come and go, can often be after she eats something.  She states it will happen occasionally, perhaps once per week.  Tends to be short-lived when she gets it.  She denies any clear triggers for this.  She denies any radiation of the pain to her back or shoulder.  She has occasional nausea and vomiting but not frequently.  She states she has had some reflux for a long time.  Has been on Prilosec 20 mg daily for this.  She states the reflux still bothers her despite this, and having some belching that bothers her as well.  She does not feel like the omeprazole helps as much as it used to.  Also takes Pepcid as needed.  She had an EGD with Dr. Christella Hartigan in 2021 for some of the symptoms and was positive for H. pylori.  It was recommended she treated with Pylera at the time but she states she did not take it at all.  Her symptoms persisted over time.  She saw her primary care who ordered an H. pylori breath test in June and was treated with an  antibiotic regimen for this.  She had a follow-up breath test in August that was negative however she was still taking PPI at that time.  She previously had a right upper quadrant ultrasound which showed no gallstones, Dr. Christella Hartigan was considering having her seen by surgery anyway for her symptoms that could be biliary colic.  She does have a small gallbladder polyp noted.  She had a follow-up ultrasound on August 30, she had no stones but a 0.5 cm gallbladder polyp.  Further, her common bile duct was 7.6 mm in size, mildly dilated.  LFTs were performed and her alk phos was elevated to 123, AST 37, ALT 31.  Otherwise she has been on Ozempic for the past few years.  She states she does not think it has been upsetting her stomach too much and she tolerates it okay.  She does have occasional constipation, uses stool softeners and MiraLAX as needed.  No blood in her stools.  Her father had colon cancer at age 28s 31s.  Her last colonoscopy was in 2017 and she had a few adenomas removed.  She is overdue for surveillance.    Prior workup: EGD 07/19/20: - Gastritis. Biopsied. - Small hiatal hernia. - Examination otherwise normal.  Surgical [P], gastric antrum and gastric body - CHRONIC ACTIVE GASTRITIS WITH HELICOBACTER PYLORI. - WARTHIN-STARRY IS POSITIVE FOR HELICOBACTER PYLORI. - NO INTESTINAL METAPLASIA, DYSPLASIA,  OR MALIGNANCY  Recommended to treat with Pylera at the time but she never took it  H pylori breath test positive 04/12/23 - treated   H pylori breath test NEGATIVE 06/15/23   RUQ [place] 06/22/23: IMPRESSION: 1. No acute abnormality identified. 2. 0.5 cm gallbladder polyp. No follow-up necessary. 3. Increased echotexture of the liver, nonspecific but may represent hepatic steatosis. 4. Mildly dilated common bile duct measuring 7.6 mm. Recommend correlation with LFTs. If abnormal, recommend further evaluation with MRCP or ERCP.   Lab Results  Component Value Date   ALT 31 07/13/2023    AST 37 07/13/2023   ALKPHOS 123 (H) 07/13/2023   BILITOT 0.3 07/13/2023    Past Medical History:  Diagnosis Date   Asthma    Cellulitis and abscess of unspecified site    Digestive-genital tract fistula, female    GERD (gastroesophageal reflux disease)    Hyperlipidemia    Hypertension    Obesity, unspecified    Seasonal allergies    Type II or unspecified type diabetes mellitus without mention of complication, uncontrolled    Vaginitis and vulvovaginitis, unspecified      Past Surgical History:  Procedure Laterality Date   boils removed     pubic area   CESAREAN SECTION     x2   COLONOSCOPY  2017   [facility]   ESOPHAGOGASTRODUODENOSCOPY     HIP ARTHROPLASTY Left 11/17/2021   Family History  Problem Relation Age of Onset   Colon cancer Father 45   Heart disease Mother    Esophageal cancer Neg Hx    Rectal cancer Neg Hx    Stomach cancer Neg Hx    Social History   Tobacco Use   Smoking status: Former    Passive exposure: Past   Smokeless tobacco: Never  Vaping Use   Vaping status: Never Used  Substance Use Topics   Alcohol use: Yes    Comment: occasional; once monthly wine   Drug use: Never   Current Outpatient Medications  Medication Sig Dispense Refill   albuterol (VENTOLIN HFA) 108 (90 Base) MCG/ACT inhaler Inhale 2 puffs into the lungs every 6 (six) hours as needed for wheezing or shortness of breath. 8 g 0   aspirin EC 81 MG tablet Take 81 mg by mouth daily.     atorvastatin (LIPITOR) 20 MG tablet Take 20 mg by mouth daily.     cetirizine (ZYRTEC) 10 MG tablet Take 10 mg by mouth daily.     famotidine (PEPCID) 20 MG tablet Take 1 tablet (20 mg total) by mouth as needed for heartburn or indigestion.     fluticasone-salmeterol (ADVAIR) 100-50 MCG/ACT AEPB Inhale 1 puff into the lungs 2 (two) times daily. 1 each 3   isosorbide mononitrate (IMDUR) 60 MG 24 hr tablet Take 1 tablet by mouth every morning.     losartan (COZAAR) 100 MG tablet Take 100 mg  by mouth daily.     metFORMIN (GLUCOPHAGE) 1000 MG tablet Take 1,000 mg by mouth 2 (two) times daily with a meal.     montelukast (SINGULAIR) 10 MG tablet Take 1 tablet (10 mg total) by mouth at bedtime. 30 tablet 3   Semaglutide,0.25 or 0.5MG /DOS, 2 MG/3ML SOPN Inject 0.25 mg into the skin once a week. 3 mL 1   spironolactone (ALDACTONE) 50 MG tablet Take 50 mg by mouth daily.     omeprazole (PRILOSEC) 20 MG capsule Take 1 capsule (20 mg total) by mouth 2 (two) times daily  for 7 days. 14 capsule 0   No current facility-administered medications for this visit.   Allergies  Allergen Reactions   Penicillins Hives and Itching     Review of Systems: All systems reviewed and negative except where noted in HPI.   Labs per HPI   Physical Exam: BP 130/66   Ht 5\' 2"  (1.575 m)   Wt 183 lb (83 kg)   BMI 33.47 kg/m  Constitutional: Pleasant,well-developed, female in no acute distress. HEENT: Normocephalic and atraumatic. Conjunctivae are normal. No scleral icterus. Neck supple.  Cardiovascular: Normal rate, regular rhythm.  Pulmonary/chest: Effort normal and breath sounds normal.  Abdominal: Soft, nondistended, nontender. There are no masses palpable. No hepatomegaly. Extremities: no edema Lymphadenopathy: No cervical adenopathy noted. Neurological: Alert and oriented to person place and time. Skin: Skin is warm and dry. No rashes noted. Psychiatric: Normal mood and affect. Behavior is normal.   ASSESSMENT: 67 y.o. female here for assessment of the following  1. RUQ pain   2. Abnormal liver diagnostic imaging   3. Gastroesophageal reflux disease, unspecified whether esophagitis present   4. History of Helicobacter pylori infection   5. History of colon polyps    Ongoing intermittent right upper quadrant pain that can be postprandial.  No gallstones in her gallbladder but a small gallbladder polyp, symptoms still could represent biliary colic.  Recent ultrasound shows a mildly  dilated CBD and her alk phos is mildly elevated.  Additionally, she had a history of an EGD to evaluate this pain a few years ago and was positive for H. pylori, was not treated until recently and follow-up H. pylori breath test negative however that is in the setting of PPI use which could lead to false positive test.  Discussed options with her at this point in time.  Recommend H. pylori Diatherix swab to make sure truly negative and that she does not need further therapy for the H. pylori.  Given her symptoms and a slightly dilated bile duct recommending an MRCP to further evaluate.  Discussed what this is and she wishes to proceed.  If she has clear pathology or abnormality there we will address it.  If it is normal, may consider surgical referral for consideration of empiric cholecystectomy pending her course.   Her reflux is not well-controlled on the current regimen.  We discussed switching her Prilosec to Protonix and will change the dose to 40 mg daily for 1 month trial and see if that helps her reflux symptoms, and to determine if it makes any difference in her right upper quadrant pain as well.  Otherwise she is overdue for surveillance colonoscopy for history of polyps and family history of colon cancer.  Discussed the procedure, risk benefits of the exam and anesthesia and she wants to proceed   PLAN: - schedule MRCP, consider surgical referral pending her course - H pylori diatherix swab - confirm this has been eradicated - change prilosec to protonix 40mg  / day - schedule for colonoscopy in the [facility]  Harlin Rain, MD [facility]  CC: Suzan Slick, MD

## 2023-08-05 ENCOUNTER — Encounter: Payer: Self-pay | Admitting: Certified Registered Nurse Anesthetist

## 2023-08-06 ENCOUNTER — Telehealth: Payer: Self-pay | Admitting: Gastroenterology

## 2023-08-06 NOTE — Telephone Encounter (Signed)
Patient is scheduled for MRCP on this Wednesday, 08/08/23. She states that she was told we would send in medication prior to the MRCP due to her history of claustrophobia.   Dr Adela Lank, please advise.Marland KitchenMarland Kitchen

## 2023-08-06 NOTE — Telephone Encounter (Signed)
Inbound call from patient stating she need a medication call in for 10/16 MRI. Patient requesting a call back. Please advise, thank you.

## 2023-08-07 MED ORDER — DIAZEPAM 5 MG PO TABS: ORAL_TABLET | ORAL | 0 refills | Status: DC

## 2023-08-07 NOTE — Telephone Encounter (Signed)
Rx called to patient's pharmacy. Patient has been advised of this information.

## 2023-08-07 NOTE — Telephone Encounter (Signed)
Okay to give a dose of valium 5mg  PO once 30 minutes prior to MRI. Thanks

## 2023-08-08 ENCOUNTER — Ambulatory Visit (HOSPITAL_COMMUNITY)
Admission: RE | Admit: 2023-08-08 | Discharge: 2023-08-08 | Disposition: A | Discharge status: Home / Self Care | Payer: Medicare HMO | Source: Ambulatory Visit | Attending: Gastroenterology | Admitting: Gastroenterology

## 2023-08-08 ENCOUNTER — Other Ambulatory Visit: Payer: Self-pay | Admitting: Gastroenterology

## 2023-08-08 DIAGNOSIS — Z8619 Personal history of other infectious and parasitic diseases: Secondary | ICD-10-CM | POA: Diagnosis present

## 2023-08-08 DIAGNOSIS — R932 Abnormal findings on diagnostic imaging of liver and biliary tract: Secondary | ICD-10-CM | POA: Diagnosis present

## 2023-08-08 DIAGNOSIS — Z860100 Personal history of colon polyps, unspecified: Secondary | ICD-10-CM | POA: Diagnosis present

## 2023-08-08 DIAGNOSIS — K219 Gastro-esophageal reflux disease without esophagitis: Secondary | ICD-10-CM | POA: Diagnosis present

## 2023-08-08 DIAGNOSIS — R1011 Right upper quadrant pain: Secondary | ICD-10-CM

## 2023-08-08 MED ORDER — GADOBUTROL 1 MMOL/ML IV SOLN
8.0000 mL | Freq: Once | INTRAVENOUS | Status: AC | PRN
  Administered 2023-08-08: 8 mL via INTRAVENOUS

## 2023-08-10 ENCOUNTER — Ambulatory Visit (AMBULATORY_SURGERY_CENTER): Payer: Medicare HMO | Admitting: Gastroenterology

## 2023-08-10 ENCOUNTER — Encounter: Payer: Self-pay | Admitting: Gastroenterology

## 2023-08-10 VITALS — BP 159/76 | HR 32 | Temp 97.3°F | Resp 14 | Ht 62.0 in | Wt 183.0 lb

## 2023-08-10 DIAGNOSIS — Z860101 Personal history of adenomatous and serrated colon polyps: Secondary | ICD-10-CM | POA: Diagnosis not present

## 2023-08-10 DIAGNOSIS — Z09 Encounter for follow-up examination after completed treatment for conditions other than malignant neoplasm: Secondary | ICD-10-CM | POA: Diagnosis present

## 2023-08-10 DIAGNOSIS — K635 Polyp of colon: Secondary | ICD-10-CM

## 2023-08-10 DIAGNOSIS — Z860100 Personal history of colon polyps, unspecified: Secondary | ICD-10-CM

## 2023-08-10 DIAGNOSIS — D124 Benign neoplasm of descending colon: Secondary | ICD-10-CM

## 2023-08-10 DIAGNOSIS — D125 Benign neoplasm of sigmoid colon: Secondary | ICD-10-CM

## 2023-08-10 DIAGNOSIS — D123 Benign neoplasm of transverse colon: Secondary | ICD-10-CM

## 2023-08-10 MED ORDER — SODIUM CHLORIDE 0.9 % IV SOLN: 500.0000 mL | Freq: Once | INTRAVENOUS | Status: DC

## 2023-08-10 NOTE — Patient Instructions (Signed)
Discharge instructions given. Handouts on polyps,diverticulosis and hemorrhoids. Resume previous medications. YOU HAD AN ENDOSCOPIC PROCEDURE TODAY AT THE [facility]:   Refer to the procedure report that was given to you for any specific questions about what was found during the examination.  If the procedure report does not answer your questions, please call your gastroenterologist to clarify.  If you requested that your care partner not be given the details of your procedure findings, then the procedure report has been included in a sealed envelope for you to review at your convenience later.  YOU SHOULD EXPECT: Some feelings of bloating in the abdomen. Passage of more gas than usual.  Walking can help get rid of the air that was put into your GI tract during the procedure and reduce the bloating. If you had a lower endoscopy (such as a colonoscopy or flexible sigmoidoscopy) you may notice spotting of blood in your stool or on the toilet paper. If you underwent a bowel prep for your procedure, you may not have a normal bowel movement for a few days.  Please Note:  You might notice some irritation and congestion in your nose or some drainage.  This is from the oxygen used during your procedure.  There is no need for concern and it should clear up in a day or so.  SYMPTOMS TO REPORT IMMEDIATELY:  Following lower endoscopy (colonoscopy or flexible sigmoidoscopy):  Excessive amounts of blood in the stool  Significant tenderness or worsening of abdominal pains  Swelling of the abdomen that is new, acute  Fever of 100F or higher   For urgent or emergent issues, a gastroenterologist can be reached at any hour by calling [phone]. Do not use MyChart messaging for urgent concerns.    DIET:  We do recommend a small meal at first, but then you may proceed to your regular diet.  Drink plenty of fluids but you should avoid alcoholic beverages for 24 hours.  ACTIVITY:  You should  plan to take it easy for the rest of today and you should NOT DRIVE or use heavy machinery until tomorrow (because of the sedation medicines used during the test).    FOLLOW UP: Our staff will call the number listed on your records the next business day following your procedure.  We will call around 7:15- 8:00 am to check on you and address any questions or concerns that you may have regarding the information given to you following your procedure. If we do not reach you, we will leave a message.     If any biopsies were taken you will be contacted by phone or by letter within the next 1-3 weeks.  Please call us at [phone] if you have not heard about the biopsies in 3 weeks.    SIGNATURES/CONFIDENTIALITY: You and/or your care partner have signed paperwork which will be entered into your electronic medical record.  These signatures attest to the fact that that the information above on your After Visit Summary has been reviewed and is understood.  Full responsibility of the confidentiality of this discharge information lies with you and/or your care-partner. 

## 2023-08-10 NOTE — Progress Notes (Signed)
Report given to PACU, vss 

## 2023-08-10 NOTE — Op Note (Signed)
[facility] Patient Name: Jasmine Harris Procedure Date: 08/10/2023 8:33 AM MRN: [id number] Endoscopist: Viviann Spare P. Adela Lank , MD, [id number] Age: 67 Referring MD:  Date of Birth: [birth year] Gender: Female Account #: [id number] Procedure:                Colonoscopy Indications:              High risk colon cancer surveillance: Personal                            history of colonic polyps - adenoma removed 12/2015,                            father had colon cancer dx age 65s Medicines:                Monitored Anesthesia Care Procedure:                Pre-Anesthesia Assessment:                           - Prior to the procedure, a History and Physical                            was performed, and patient medications and                            allergies were reviewed. The patient's tolerance of                            previous anesthesia was also reviewed. The risks                            and benefits of the procedure and the sedation                            options and risks were discussed with the patient.                            All questions were answered, and informed consent                            was obtained. Prior Anticoagulants: The patient has                            taken no anticoagulant or antiplatelet agents. ASA                            Grade Assessment: II - A patient with mild systemic                            disease. After reviewing the risks and benefits,                            the patient was deemed in satisfactory condition to  undergo the procedure.                           After obtaining informed consent, the colonoscope                            was passed under direct vision. Throughout the                            procedure, the patient's blood pressure, pulse, and                            oxygen saturations were monitored continuously. The                            Olympus Scope SN:  [id number] was introduced through                            the anus and advanced to the the cecum, identified                            by appendiceal orifice and ileocecal valve. The                            colonoscopy was performed without difficulty. The                            patient tolerated the procedure well. The quality                            of the bowel preparation was good. The ileocecal                            valve, appendiceal orifice, and rectum were                            photographed. Scope In: 8:44:36 AM Scope Out: 8:58:42 AM Scope Withdrawal Time: 0 hours 8 minutes 16 seconds  Total Procedure Duration: 0 hours 14 minutes 6 seconds  Findings:                 The perianal and digital rectal examinations were                            normal.                           Many small-mouthed diverticula were found in the                            distal transverse colon, cecum and left colon.                            Highest burden in the left colon.  A 5 mm polyp was found in the transverse colon. The                            polyp was flat. The polyp was removed with a cold                            snare. Resection and retrieval were complete.                           A 3 mm polyp was found in the descending colon. The                            polyp was sessile. The polyp was removed with a                            cold snare. Resection and retrieval were complete.                           A 3 mm polyp was found in the sigmoid colon. The                            polyp was sessile. The polyp was removed with a                            cold snare. Resection and retrieval were complete.                           Internal hemorrhoids were found during retroflexion.                           The exam was otherwise without abnormality. Complications:            No immediate complications. Estimated blood loss:                             Minimal. Estimated Blood Loss:     Estimated blood loss was minimal. Impression:               - Diverticulosis in the distal transverse colon, in                            the cecum and in the left colon.                           - One 5 mm polyp in the transverse colon, removed                            with a cold snare. Resected and retrieved.                           - One 3 mm polyp in the descending colon, removed  with a cold snare. Resected and retrieved.                           - One 3 mm polyp in the sigmoid colon, removed with                            a cold snare. Resected and retrieved.                           - Internal hemorrhoids.                           - The examination was otherwise normal. Recommendation:           - Patient has a contact number available for                            emergencies. The signs and symptoms of potential                            delayed complications were discussed with the                            patient. Return to normal activities tomorrow.                            Written discharge instructions were provided to the                            patient.                           - Resume previous diet.                           - Continue present medications.                           - Await pathology results. Viviann Spare P. Braydon Kullman, MD 08/10/2023 9:04:34 AM This report has been signed electronically.

## 2023-08-10 NOTE — Progress Notes (Signed)
Pt's states no medical or surgical changes since previsit or office visit. 

## 2023-08-10 NOTE — Progress Notes (Signed)
Called to room to assist during endoscopic procedure.  Patient ID and intended procedure confirmed with present staff. Received instructions for my participation in the procedure from the performing physician.  

## 2023-08-10 NOTE — Progress Notes (Signed)
History and Physical Interval Note:  Seen on 08/01/23 - history of adenoma removed 12/2015, father had colon cancer dx age 53s. No interval changes since her last visit. Have discussed risks / benefits and she wishes to proceed.   08/10/2023 8:36 AM  Jasmine Harris  has presented today for endoscopic procedure(s), with the diagnosis of  Encounter Diagnosis  Name Primary?   History of colon polyps Yes  .  The various methods of evaluation and treatment have been discussed with the patient and/or family. After consideration of risks, benefits and other options for treatment, the patient has consented to  the endoscopic procedure(s).   The patient's history has been reviewed, patient examined, no change in status, stable for surgery.  I have reviewed the patient's chart and labs.  Questions were answered to the patient's satisfaction.    Harlin Rain, MD [facility]

## 2023-08-13 ENCOUNTER — Telehealth: Payer: Self-pay

## 2023-08-13 NOTE — Telephone Encounter (Signed)
Left message on answering machine. 

## 2023-08-14 LAB — SURGICAL PATHOLOGY

## 2023-08-15 ENCOUNTER — Ambulatory Visit: Payer: Medicare HMO | Admitting: Family Medicine

## 2023-08-15 ENCOUNTER — Ambulatory Visit (INDEPENDENT_AMBULATORY_CARE_PROVIDER_SITE_OTHER): Payer: Medicare HMO | Admitting: Family Medicine

## 2023-08-15 VITALS — BP 118/61 | HR 65 | Temp 98.3°F | Resp 18 | Ht 62.0 in | Wt 182.5 lb

## 2023-08-15 DIAGNOSIS — Z6834 Body mass index (BMI) 34.0-34.9, adult: Secondary | ICD-10-CM

## 2023-08-15 DIAGNOSIS — E1165 Type 2 diabetes mellitus with hyperglycemia: Secondary | ICD-10-CM

## 2023-08-15 MED ORDER — SEMAGLUTIDE(0.25 OR 0.5MG/DOS) 2 MG/3ML ~~LOC~~ SOPN: 0.5000 mg | PEN_INJECTOR | SUBCUTANEOUS | 1 refills | Status: DC

## 2023-08-15 NOTE — Progress Notes (Signed)
   Established Patient Office Visit  Subjective   Patient ID: Jasmine Harris, female    DOB: 02/21/1956  Age: 67 y.o. MRN: [id number]  Chief Complaint  Patient presents with   Medical Management of Chronic Issues    Patient is here for a 1 month follow up for weight management     HPI  Weight management/Diabetes Pt is here for 4 weeks follow up. She was started on Ozempic 0.25mg  weekly 4 weeks ago. Tolerating injection no nausea or vomiting. Sugars have been coming down reports her sugar was 99 when she went for her colonoscopy. She has been walking 2 x a week.  She has been cutting carbohydrates/starch.  She is on statin, aspirin, and ARB for diabetes management. Lost 1 lbs since last visit.   Review of Systems  All other systems reviewed and are negative.    Objective:     BP 118/61   Pulse 65   Temp 98.3 F (36.8 C) (Oral)   Resp 18   Ht 5\' 2"  (1.575 m)   Wt 182 lb 8 oz (82.8 kg)   SpO2 99%   BMI 33.38 kg/m  BP Readings from Last 3 Encounters:  08/15/23 118/61  08/10/23 (!) 159/76  08/01/23 130/66      Physical Exam Vitals and nursing note reviewed.  Constitutional:      Appearance: Normal appearance. She is normal weight.  HENT:     Head: Normocephalic and atraumatic.     Right Ear: Tympanic membrane and external ear normal.     Left Ear: External ear normal.     Nose: Nose normal.     Mouth/Throat:     Mouth: Mucous membranes are moist.     Pharynx: Oropharynx is clear.  Eyes:     Conjunctiva/sclera: Conjunctivae normal.     Pupils: Pupils are equal, round, and reactive to light.  Cardiovascular:     Rate and Rhythm: Normal rate.  Pulmonary:     Effort: Pulmonary effort is normal.  Skin:    General: Skin is warm.     Capillary Refill: Capillary refill takes less than 2 seconds.  Neurological:     General: No focal deficit present.     Mental Status: She is alert and oriented to person, place, and time. Mental status is at baseline.  Psychiatric:         Mood and Affect: Mood normal.        Behavior: Behavior normal.        Thought Content: Thought content normal.        Judgment: Judgment normal.    No results found for any visits on 08/15/23.  Last hemoglobin A1c Lab Results  Component Value Date   HGBA1C 7.6 (H) 07/13/2023      The 10-year ASCVD risk score (Arnett DK, et al., 2019) is: 19.8%    Assessment & Plan:   Problem List Items Addressed This Visit   None Uncontrolled type 2 diabetes mellitus with hyperglycemia (HCC) -     Semaglutide(0.25 or 0.5MG /DOS); Inject 0.5 mg into the skin once a week.  Dispense: 3 mL; Refill: 1  BMI 34.0-34.9,adult -     Semaglutide(0.25 or 0.5MG /DOS); Inject 0.5 mg into the skin once a week.  Dispense: 3 mL; Refill: 1   To increase Semaglutide to 0.5mg  weekly. Watch diet and exercise. See back in 2 months for follow up and recheck.  No follow-ups on file.    Suzan Slick, MD

## 2023-08-16 ENCOUNTER — Telehealth: Payer: Self-pay

## 2023-08-16 NOTE — Telephone Encounter (Signed)
Please be advised the Diatherix stool test for H pylori came back "Not Performed" because the patient failed to label the specimen.  We will need to redo the Diatherix test or order a H pylori stool test from our lab.  Please advise

## 2023-08-16 NOTE — Telephone Encounter (Signed)
If she has been taking protonix then would prefer the diatherix swab. Otherwise she needs to hold PPI at least 2 weeks prior to submitting a regular H pylori stool antigen test. Thanks

## 2023-08-16 NOTE — Telephone Encounter (Signed)
Called and spoke to patient. She will come in on Monday to get another Diatherix kit.  Requested we apply the label to the tube bc she wasn't sure where it was.

## 2023-08-17 ENCOUNTER — Other Ambulatory Visit: Payer: Self-pay | Admitting: *Deleted

## 2023-08-17 DIAGNOSIS — R932 Abnormal findings on diagnostic imaging of liver and biliary tract: Secondary | ICD-10-CM

## 2023-08-21 ENCOUNTER — Other Ambulatory Visit: Payer: Medicare HMO

## 2023-08-21 DIAGNOSIS — R932 Abnormal findings on diagnostic imaging of liver and biliary tract: Secondary | ICD-10-CM

## 2023-08-21 LAB — HEPATIC FUNCTION PANEL
ALT: 11 U/L (ref 0–35)
AST: 12 U/L (ref 0–37)
Albumin: 4.3 g/dL (ref 3.5–5.2)
Alkaline Phosphatase: 97 U/L (ref 39–117)
Bilirubin, Direct: 0 mg/dL (ref 0.0–0.3)
Total Bilirubin: 0.3 mg/dL (ref 0.2–1.2)
Total Protein: 7.4 g/dL (ref 6.0–8.3)

## 2023-08-21 LAB — ALKALINE PHOSPHATASE: Alkaline Phosphatase: 97 U/L (ref 39–117)

## 2023-08-27 ENCOUNTER — Encounter: Payer: Self-pay | Admitting: Gastroenterology

## 2023-08-29 DIAGNOSIS — R809 Proteinuria, unspecified: Secondary | ICD-10-CM | POA: Insufficient documentation

## 2023-09-14 ENCOUNTER — Other Ambulatory Visit (HOSPITAL_COMMUNITY): Payer: Self-pay | Admitting: General Surgery

## 2023-09-14 DIAGNOSIS — R1011 Right upper quadrant pain: Secondary | ICD-10-CM

## 2023-10-01 ENCOUNTER — Ambulatory Visit (HOSPITAL_COMMUNITY): Payer: Medicare HMO

## 2023-10-04 ENCOUNTER — Encounter (HOSPITAL_COMMUNITY)
Admission: RE | Admit: 2023-10-04 | Discharge: 2023-10-04 | Disposition: A | Discharge status: Home / Self Care | Payer: Medicare HMO | Source: Ambulatory Visit | Attending: General Surgery | Admitting: General Surgery

## 2023-10-04 DIAGNOSIS — R1011 Right upper quadrant pain: Secondary | ICD-10-CM | POA: Diagnosis present

## 2023-10-04 MED ORDER — TECHNETIUM TC 99M MEBROFENIN IV KIT
5.2000 | PACK | Freq: Once | INTRAVENOUS | Status: AC | PRN
  Administered 2023-10-04: 5.2 via INTRAVENOUS

## 2023-10-22 ENCOUNTER — Ambulatory Visit (INDEPENDENT_AMBULATORY_CARE_PROVIDER_SITE_OTHER): Payer: Medicare HMO | Admitting: Family Medicine

## 2023-10-22 ENCOUNTER — Encounter: Payer: Self-pay | Admitting: Family Medicine

## 2023-10-22 VITALS — BP 137/66 | HR 60 | Temp 98.1°F | Resp 18 | Ht 62.0 in | Wt 185.0 lb

## 2023-10-22 DIAGNOSIS — Z6834 Body mass index (BMI) 34.0-34.9, adult: Secondary | ICD-10-CM

## 2023-10-22 DIAGNOSIS — E119 Type 2 diabetes mellitus without complications: Secondary | ICD-10-CM | POA: Diagnosis not present

## 2023-10-22 DIAGNOSIS — G8929 Other chronic pain: Secondary | ICD-10-CM

## 2023-10-22 DIAGNOSIS — Z96642 Presence of left artificial hip joint: Secondary | ICD-10-CM

## 2023-10-22 DIAGNOSIS — M25512 Pain in left shoulder: Secondary | ICD-10-CM

## 2023-10-22 DIAGNOSIS — Z794 Long term (current) use of insulin: Secondary | ICD-10-CM | POA: Diagnosis not present

## 2023-10-22 LAB — POCT GLYCOSYLATED HEMOGLOBIN (HGB A1C): Hemoglobin A1C: 6.3 % — AB (ref 4.0–5.6)

## 2023-10-22 MED ORDER — SEMAGLUTIDE (1 MG/DOSE) 4 MG/3ML ~~LOC~~ SOPN: 1.0000 mg | PEN_INJECTOR | SUBCUTANEOUS | 1 refills | Status: DC

## 2023-10-22 NOTE — Progress Notes (Signed)
Established Patient Office Visit  Subjective   Patient ID: Jasmine Harris, female    DOB: 30-Mar-1956  Age: 67 y.o. MRN: [id number]  Chief Complaint  Patient presents with   Medical Management of Chronic Issues    Patient is here for 2 month follow up , and HGA1C    HPI  Diabetes She is taking Metformi 1000mg  BID along with added Semaglutide 0.25mg  weekly. She was increased to 0.5mg  weekly 2 months ago. She has gained weight since last time. She is also taking Aspirin 81mg  along with Atorvastatin 20mg  at night.    Left shoulder pain She has had left shoulder pain for the last 2 months. No trauma or injury. She has tried OTC muscle rubs for it. Pain is worse at night. Pain is 8/10. Pain is present until she takes something. She has muscle relaxers for her hip that she has tried and this helps. She reports the Tizanidine makes her sleepy so she's only taking it at night.   She also needs handicap sticker renewed due to left hip pain. She has hx of left hip surgery over a year ago. It's difficult for her to walk long distances.   Review of Systems  Musculoskeletal:  Positive for joint pain.  All other systems reviewed and are negative.     Objective:     BP 137/66   Pulse 60   Temp 98.1 F (36.7 C) (Oral)   Resp 18   Ht 5\' 2"  (1.575 m)   Wt 185 lb (83.9 kg)   SpO2 100%   BMI 33.84 kg/m  BP Readings from Last 3 Encounters:  10/22/23 137/66  08/15/23 118/61  08/10/23 (!) 159/76      Physical Exam Vitals and nursing note reviewed.  Constitutional:      Appearance: Normal appearance. She is normal weight.  HENT:     Head: Normocephalic and atraumatic.     Right Ear: External ear normal.     Left Ear: External ear normal.     Nose: Nose normal.     Mouth/Throat:     Mouth: Mucous membranes are moist.     Pharynx: Oropharynx is clear.  Eyes:     Conjunctiva/sclera: Conjunctivae normal.     Pupils: Pupils are equal, round, and reactive to light.  Cardiovascular:      Rate and Rhythm: Normal rate.  Pulmonary:     Effort: Pulmonary effort is normal.  Musculoskeletal:        General: Normal range of motion.     Comments: ROM of left shoulder full but with pain with abduction  Skin:    General: Skin is warm.     Capillary Refill: Capillary refill takes less than 2 seconds.  Neurological:     General: No focal deficit present.     Mental Status: She is alert and oriented to person, place, and time. Mental status is at baseline.  Psychiatric:        Mood and Affect: Mood normal.        Behavior: Behavior normal.        Thought Content: Thought content normal.        Judgment: Judgment normal.     Results for orders placed or performed in visit on 10/22/23  POCT glycosylated hemoglobin (Hb A1C)  Result Value Ref Range   Hemoglobin A1C 6.3 (A) 4.0 - 5.6 %   HbA1c POC (<> result, manual entry)     HbA1c, POC (prediabetic range)  HbA1c, POC (controlled diabetic range)      Last hemoglobin A1c Lab Results  Component Value Date   HGBA1C 6.3 (A) 10/22/2023      The 10-year ASCVD risk score (Arnett DK, et al., 2019) is: 26.5%    Assessment & Plan:   Problem List Items Addressed This Visit       Endocrine   Controlled type 2 diabetes mellitus without complication, with long-term current use of insulin (HCC) - Primary   Relevant Medications   Semaglutide, 1 MG/DOSE, 4 MG/3ML SOPN   Other Relevant Orders   POCT glycosylated hemoglobin (Hb A1C) (Completed)     Other   S/P total left hip arthroplasty   Other Visit Diagnoses       BMI 34.0-34.9,adult       Relevant Medications   Semaglutide, 1 MG/DOSE, 4 MG/3ML SOPN     Chronic left shoulder pain          Controlled type 2 diabetes mellitus without complication, with long-term current use of insulin (HCC) -     POCT glycosylated hemoglobin (Hb A1C) -     Semaglutide (1 MG/DOSE); Inject 1 mg as directed once a week.  Dispense: 3 mL; Refill: 1  BMI 34.0-34.9,adult -      Semaglutide (1 MG/DOSE); Inject 1 mg as directed once a week.  Dispense: 3 mL; Refill: 1  Chronic left shoulder pain  S/P total left hip arthroplasty   A1c much better, work on diet and exercise. Increased Semaglutide to 1mg  weekly to help with weight loss. See back in 6 weeks.  Left shoulder pain likely adhesive capsulitis, related to diabetes. Work on ROM exercises and advised pt can take Tizanidine 4mg  at night prn as this has helped with her pain.  Return in about 6 weeks (around 12/03/2023) for Weight Management.  Renewed DMV placard for 6 months. Advised to follow up with Ortho.    Suzan Slick, MD

## 2023-10-29 ENCOUNTER — Other Ambulatory Visit: Payer: Self-pay | Admitting: Family Medicine

## 2023-10-29 DIAGNOSIS — J4531 Mild persistent asthma with (acute) exacerbation: Secondary | ICD-10-CM

## 2023-11-05 ENCOUNTER — Other Ambulatory Visit: Payer: Self-pay | Admitting: Family Medicine

## 2023-11-05 DIAGNOSIS — J4531 Mild persistent asthma with (acute) exacerbation: Secondary | ICD-10-CM

## 2023-11-25 ENCOUNTER — Other Ambulatory Visit: Payer: Self-pay | Admitting: Gastroenterology

## 2023-11-25 ENCOUNTER — Other Ambulatory Visit: Payer: Self-pay | Admitting: Family Medicine

## 2023-11-25 DIAGNOSIS — J4531 Mild persistent asthma with (acute) exacerbation: Secondary | ICD-10-CM

## 2023-12-03 ENCOUNTER — Ambulatory Visit (INDEPENDENT_AMBULATORY_CARE_PROVIDER_SITE_OTHER): Payer: Medicare HMO | Admitting: Family Medicine

## 2023-12-03 ENCOUNTER — Telehealth: Payer: Self-pay

## 2023-12-03 ENCOUNTER — Encounter: Payer: Self-pay | Admitting: Family Medicine

## 2023-12-03 VITALS — BP 131/60 | HR 78 | Temp 98.4°F | Resp 18 | Ht 62.0 in | Wt 172.7 lb

## 2023-12-03 DIAGNOSIS — R932 Abnormal findings on diagnostic imaging of liver and biliary tract: Secondary | ICD-10-CM

## 2023-12-03 DIAGNOSIS — Z794 Long term (current) use of insulin: Secondary | ICD-10-CM | POA: Diagnosis not present

## 2023-12-03 DIAGNOSIS — Z6831 Body mass index (BMI) 31.0-31.9, adult: Secondary | ICD-10-CM | POA: Diagnosis not present

## 2023-12-03 DIAGNOSIS — E119 Type 2 diabetes mellitus without complications: Secondary | ICD-10-CM

## 2023-12-03 NOTE — Progress Notes (Signed)
   Established Patient Office Visit  Subjective   Patient ID: Jasmine Harris, female    DOB: Jan 11, 1956  Age: 68 y.o. MRN: [id number]  Chief Complaint  Patient presents with   Medical Management of Chronic Issues    Patient is here for a 6 week weight management follow up    HPI  Weight management Pt has lost 13 lbs since being on Ozempic  1mg  weekly. She is doing well with the injection to help with weight and diabetes. She reports she is no longer able to afford this medicine as it's costing her $200 a month. She denies any side effects from this injection.  Review of Systems  Constitutional:  Positive for weight loss.  All other systems reviewed and are negative.     Objective:     BP 131/60   Pulse 78   Temp 98.4 F (36.9 C) (Oral)   Resp 18   Ht 5\' 2"  (1.575 m)   Wt 172 lb 11.2 oz (78.3 kg)   SpO2 96%   BMI 31.59 kg/m    Physical Exam Vitals and nursing note reviewed.  Constitutional:      Appearance: Normal appearance. She is normal weight.  HENT:     Head: Normocephalic and atraumatic.     Right Ear: External ear normal.     Left Ear: External ear normal.     Nose: Nose normal.     Mouth/Throat:     Mouth: Mucous membranes are moist.     Pharynx: Oropharynx is clear.  Eyes:     Conjunctiva/sclera: Conjunctivae normal.     Pupils: Pupils are equal, round, and reactive to light.  Cardiovascular:     Rate and Rhythm: Normal rate.  Pulmonary:     Effort: Pulmonary effort is normal.  Skin:    General: Skin is warm.     Capillary Refill: Capillary refill takes less than 2 seconds.  Neurological:     General: No focal deficit present.     Mental Status: She is alert and oriented to person, place, and time. Mental status is at baseline.  Psychiatric:        Mood and Affect: Mood normal.        Behavior: Behavior normal.        Thought Content: Thought content normal.        Judgment: Judgment normal.     No results found for any visits on  12/03/23.    The 10-year ASCVD risk score (Arnett DK, et al., 2019) is: 24.3%    Assessment & Plan:   Problem List Items Addressed This Visit       Endocrine   Controlled type 2 diabetes mellitus without complication, with long-term current use of insulin (HCC) - Primary   Other Visit Diagnoses       BMI 31.0-31.9,adult         Controlled type 2 diabetes mellitus without complication, with long-term current use of insulin (HCC)  BMI 31.0-31.9,adult   BMI dropped with Ozempic  from 34 to 31. Great work. To get in touch with insurance to find out what injections they will pay for. Continue with diet and exercise, down 13 lbs since December 2024.  Diabetes much better control, A1c 6.3 in December. 2024.  Return in about 3 months (around 03/01/2024) for Sub AWV.    Manette Section, MD

## 2023-12-03 NOTE — Telephone Encounter (Signed)
MyChart message to patient to go to the lab 

## 2023-12-03 NOTE — Telephone Encounter (Signed)
-----   Message from [facility] Sidon H sent at 08/23/2023  8:05 AM EDT ----- Regarding: due for LFTs Due for LFTs in Feb 2025

## 2023-12-05 NOTE — Telephone Encounter (Signed)
Called and left detailed message for patient to go to the lab for LFTs are her next convenience

## 2023-12-05 NOTE — Telephone Encounter (Signed)
Patient returning call. Requesting a call back .

## 2023-12-05 NOTE — Telephone Encounter (Signed)
Called and spoke to patient. She understands she does not need to fast and can go to the lab at her convenience. Our lab is located in the basement of our building, located at [address]. They are open Monday through Friday from 7:30 am to 5:00 pm. You do not need an appointment.

## 2023-12-10 ENCOUNTER — Ambulatory Visit: Payer: Self-pay | Admitting: Family Medicine

## 2023-12-10 NOTE — Telephone Encounter (Signed)
  Chief Complaint: Cough Symptoms: nausea with phlegm Frequency: Since last Thursday Pertinent Negatives: Patient denies fever, SOB, CP, N/V Disposition: [] ED /[] Urgent Care (no appt availability in office) / [] Appointment(In office/virtual)/ []  [facility]/ [x] Home Care/ [] Refused Recommended Disposition /[] [facility]/ []  Follow-up with PCP Additional Notes: Pt reports she began with hacking cough producing clear sputum last Thursday, she denies fever, SOB, V/D, she does note some nausea with cough when producing sputum, she notes this is clear and denies any blood/color. Pt advised on home care remedies per protocol. This RN educated pt on home care, new-worsening symptoms, when to call back/seek emergent care. Pt verbalized understanding and agrees to plan.     Reason for Triage: Patient says she has been having a hacking cough since last Thursday and she may cough up phlegm  Reason for Disposition  Cough  Answer Assessment - Initial Assessment Questions 1. ONSET: "When did the cough begin?"      Last Thursday 2. SEVERITY: "How bad is the cough today?"      "It's getting pretty constant" 3. SPUTUM: "Describe the color of your sputum" (none, dry cough; clear, white, yellow, green)     Clear 4. HEMOPTYSIS: "Are you coughing up any blood?" If so ask: "How much?" (flecks, streaks, tablespoons, etc.)     None 5. DIFFICULTY BREATHING: "Are you having difficulty breathing?" If Yes, ask: "How bad is it?" (e.g., mild, moderate, severe)    - MILD: No SOB at rest, mild SOB with walking, speaks normally in sentences, can lie down, no retractions, pulse < 100.    - MODERATE: SOB at rest, SOB with minimal exertion and prefers to sit, cannot lie down flat, speaks in phrases, mild retractions, audible wheezing, pulse 100-120.    - SEVERE: Very SOB at rest, speaks in single words, struggling to breathe, sitting hunched forward, retractions, pulse > 120      None 6. FEVER: "Do  you have a fever?" If Yes, ask: "What is your temperature, how was it measured, and when did it start?"     None 7. CARDIAC HISTORY: "Do you have any history of heart disease?" (e.g., heart attack, congestive heart failure)      None 8. LUNG HISTORY: "Do you have any history of lung disease?"  (e.g., pulmonary embolus, asthma, emphysema)     None  10. OTHER SYMPTOMS: "Do you have any other symptoms?" (e.g., runny nose, wheezing, chest pain)       None  Protocols used: Cough - Acute Productive-A-AH

## 2023-12-24 NOTE — Telephone Encounter (Signed)
 Patient returned the call and stated she will going for labs tomm afternoon.

## 2023-12-25 ENCOUNTER — Other Ambulatory Visit (INDEPENDENT_AMBULATORY_CARE_PROVIDER_SITE_OTHER)

## 2023-12-25 DIAGNOSIS — R932 Abnormal findings on diagnostic imaging of liver and biliary tract: Secondary | ICD-10-CM | POA: Diagnosis not present

## 2023-12-25 DIAGNOSIS — Z8619 Personal history of other infectious and parasitic diseases: Secondary | ICD-10-CM

## 2023-12-25 LAB — HEPATIC FUNCTION PANEL
ALT: 8 U/L (ref 0–35)
AST: 10 U/L (ref 0–37)
Albumin: 4.2 g/dL (ref 3.5–5.2)
Alkaline Phosphatase: 93 U/L (ref 39–117)
Bilirubin, Direct: 0.1 mg/dL (ref 0.0–0.3)
Total Bilirubin: 0.3 mg/dL (ref 0.2–1.2)
Total Protein: 7.2 g/dL (ref 6.0–8.3)

## 2023-12-26 ENCOUNTER — Encounter: Payer: Self-pay | Admitting: Gastroenterology

## 2023-12-26 ENCOUNTER — Other Ambulatory Visit: Payer: Self-pay | Admitting: Family Medicine

## 2023-12-26 ENCOUNTER — Encounter: Payer: Self-pay | Admitting: Family Medicine

## 2023-12-26 DIAGNOSIS — J4531 Mild persistent asthma with (acute) exacerbation: Secondary | ICD-10-CM

## 2023-12-26 DIAGNOSIS — M62838 Other muscle spasm: Secondary | ICD-10-CM

## 2023-12-26 MED ORDER — TIZANIDINE HCL 4 MG PO TABS: 4.0000 mg | ORAL_TABLET | Freq: Three times a day (TID) | ORAL | 1 refills | Status: DC | PRN

## 2024-01-20 ENCOUNTER — Other Ambulatory Visit: Payer: Self-pay | Admitting: Family Medicine

## 2024-01-20 DIAGNOSIS — J4531 Mild persistent asthma with (acute) exacerbation: Secondary | ICD-10-CM

## 2024-01-29 ENCOUNTER — Encounter: Payer: Self-pay | Admitting: Family Medicine

## 2024-01-29 DIAGNOSIS — E119 Type 2 diabetes mellitus without complications: Secondary | ICD-10-CM

## 2024-01-29 DIAGNOSIS — Z6834 Body mass index (BMI) 34.0-34.9, adult: Secondary | ICD-10-CM

## 2024-01-29 MED ORDER — SEMAGLUTIDE (1 MG/DOSE) 4 MG/3ML ~~LOC~~ SOPN: 1.0000 mg | PEN_INJECTOR | SUBCUTANEOUS | 1 refills | Status: DC

## 2024-02-11 DIAGNOSIS — M7062 Trochanteric bursitis, left hip: Secondary | ICD-10-CM | POA: Insufficient documentation

## 2024-02-15 ENCOUNTER — Other Ambulatory Visit: Payer: Self-pay | Admitting: Family Medicine

## 2024-02-15 DIAGNOSIS — J4531 Mild persistent asthma with (acute) exacerbation: Secondary | ICD-10-CM

## 2024-02-20 ENCOUNTER — Other Ambulatory Visit: Payer: Self-pay | Admitting: Family Medicine

## 2024-02-20 DIAGNOSIS — M62838 Other muscle spasm: Secondary | ICD-10-CM

## 2024-02-25 ENCOUNTER — Encounter: Payer: Self-pay | Admitting: Family Medicine

## 2024-02-25 MED ORDER — FUROSEMIDE 40 MG PO TABS: 40.0000 mg | ORAL_TABLET | Freq: Every morning | ORAL | 0 refills | Status: DC

## 2024-02-29 ENCOUNTER — Ambulatory Visit (INDEPENDENT_AMBULATORY_CARE_PROVIDER_SITE_OTHER): Payer: Medicare HMO | Admitting: Family Medicine

## 2024-02-29 ENCOUNTER — Encounter: Payer: Self-pay | Admitting: Family Medicine

## 2024-02-29 VITALS — BP 126/72 | HR 63 | Temp 97.8°F | Resp 18 | Ht 62.0 in | Wt 176.0 lb

## 2024-02-29 DIAGNOSIS — Z0000 Encounter for general adult medical examination without abnormal findings: Secondary | ICD-10-CM

## 2024-02-29 NOTE — Progress Notes (Signed)
 Subjective:   Jasmine Harris is a 68 y.o. female who presents for Medicare Annual (Subsequent) preventive examination.  Visit Complete: In person  Patient Medicare AWV questionnaire was completed by the patient on 02/29/24; I have confirmed that all information answered by patient is correct and no changes since this date.  Cardiac Risk Factors include: advanced age (>50men, >70 women);diabetes mellitus;hypertension;obesity (BMI >30kg/m2)     Objective:    Today's Vitals   02/29/24 0847 02/29/24 0901  BP: (!) 165/64 126/72  Pulse: 63   Resp: 18   Temp: 97.8 F (36.6 C)   TempSrc: Oral   SpO2: 98%   Weight: 176 lb (79.8 kg)   Height: 5\' 2"  (1.575 m)   PainSc: 5     Body mass index is 32.19 kg/m.     02/29/2024    8:53 AM 04/12/2023    9:08 AM 12/01/2015    7:38 AM  Advanced Directives  Does Patient Have a Medical Advance Directive? No No No  Would patient like information on creating a medical advance directive? Yes (MAU/Ambulatory/Procedural Areas - Information given) No - Patient declined No - patient declined information    Current Medications (verified) Outpatient Encounter Medications as of 02/29/2024  Medication Sig   albuterol  (VENTOLIN  HFA) 108 (90 Base) MCG/ACT inhaler INHALE 2 PUFFS BY MOUTH EVERY 6 HOURS AS NEEDED FOR WHEEZING FOR SHORTNESS OF BREATH   aspirin EC 81 MG tablet Take 81 mg by mouth daily.   atorvastatin (LIPITOR) 20 MG tablet Take 20 mg by mouth daily.   cetirizine (ZYRTEC) 10 MG tablet Take 10 mg by mouth daily.   famotidine  (PEPCID ) 20 MG tablet Take 1 tablet (20 mg total) by mouth as needed for heartburn or indigestion.   fluticasone -salmeterol (ADVAIR) 100-50 MCG/ACT AEPB INHALE 1 PUFF BY MOUTH TWICE DAILY   furosemide  (LASIX ) 40 MG tablet Take 1 tablet (40 mg total) by mouth every morning.   isosorbide mononitrate (IMDUR) 60 MG 24 hr tablet Take 1 tablet by mouth every morning.   losartan (COZAAR) 100 MG tablet Take 100 mg by mouth daily.    metFORMIN (GLUCOPHAGE) 1000 MG tablet Take 1,000 mg by mouth 2 (two) times daily with a meal.   montelukast  (SINGULAIR ) 10 MG tablet TAKE 1 TABLET BY MOUTH AT BEDTIME   pantoprazole  (PROTONIX ) 40 MG tablet Take 1 tablet by mouth once daily   Semaglutide , 1 MG/DOSE, 4 MG/3ML SOPN Inject 1 mg as directed once a week.   spironolactone (ALDACTONE) 50 MG tablet Take 50 mg by mouth daily.   tiZANidine  (ZANAFLEX ) 4 MG tablet TAKE 1 TO 2 TABLETS BY MOUTH EVERY 8 HOURS AS NEEDED   No facility-administered encounter medications on file as of 02/29/2024.    Allergies (verified) Penicillins   History: Past Medical History:  Diagnosis Date   Asthma    Cellulitis and abscess of unspecified site    Digestive-genital tract fistula, female    GERD (gastroesophageal reflux disease)    Hyperlipidemia    Hypertension    Obesity, unspecified    Seasonal allergies    Type II or unspecified type diabetes mellitus without mention of complication, uncontrolled    Vaginitis and vulvovaginitis, unspecified    Past Surgical History:  Procedure Laterality Date   boils removed     pubic area   CESAREAN SECTION     x2   COLONOSCOPY  2017   [facility]   ESOPHAGOGASTRODUODENOSCOPY     HIP ARTHROPLASTY Left  11/17/2021   Family History  Problem Relation Age of Onset   Colon cancer Father 38   Heart disease Mother    Esophageal cancer Neg Hx    Rectal cancer Neg Hx    Stomach cancer Neg Hx    Social History   Socioeconomic History   Marital status: Divorced    Spouse name: Not on file   Number of children: 2   Years of education: Not on file   Highest education level: Some college, no degree  Occupational History   Occupation: receptionist  Tobacco Use   Smoking status: Former    Passive exposure: Past   Smokeless tobacco: Never  Vaping Use   Vaping status: Never Used  Substance and Sexual Activity   Alcohol use: Yes    Comment: occasional; once monthly wine   Drug use: Never   Sexual  activity: Not Currently    Birth control/protection: Post-menopausal  Other Topics Concern   Not on file  Social History Narrative   Not on file   Social Drivers of Health   Financial Resource Strain: Low Risk  (02/29/2024)   Overall Financial Resource Strain (CARDIA)    Difficulty of Paying Living Expenses: Not hard at all  Food Insecurity: No Food Insecurity (02/29/2024)   Hunger Vital Sign    Worried About Running Out of Food in the Last Year: Never true    Ran Out of Food in the Last Year: Never true  Transportation Needs: No Transportation Needs (02/29/2024)   PRAPARE - Administrator, Civil Service (Medical): No    Lack of Transportation (Non-Medical): No  Physical Activity: Insufficiently Active (02/29/2024)   Exercise Vital Sign    Days of Exercise per Week: 2 days    Minutes of Exercise per Session: 30 min  Stress: No Stress Concern Present (02/29/2024)   [employer] of Occupational Health - Occupational Stress Questionnaire    Feeling of Stress : Not at all  Social Connections: Moderately Integrated (10/18/2023)   Social Connection and Isolation Panel [NHANES]    Frequency of Communication with Friends and Family: More than three times a week    Frequency of Social Gatherings with Friends and Family: More than three times a week    Attends Religious Services: More than 4 times per year    Active Member of [employer] or Organizations: Yes    Attends Engineer, structural: More than 4 times per year    Marital Status: Divorced    Tobacco Counseling Counseling given: Not Answered   Clinical Intake:  Pre-visit preparation completed: No  Pain : 0-10 Pain Score: 5  Pain Type: Chronic pain Pain Location: Shoulder Pain Orientation: Left Pain Radiating Towards: none Pain Descriptors / Indicators: Throbbing, Aching Pain Onset: More than a month ago Pain Frequency: Occasional Pain Relieving Factors: Muscle rub Effect of Pain on Daily Activities:  ADLs  Pain Relieving Factors: Muscle rub  Nutritional Status: BMI > 30  Obese Diabetes: Yes CBG done?: Yes CBG resulted in Enter/ Edit results?: Yes Did pt. bring in CBG monitor from home?: No  How often do you need to have someone help you when you read instructions, pamphlets, or other written materials from your doctor or pharmacy?: 1 - Never What is the last grade level you completed in school?: GED  Interpreter Needed?: No      Activities of Daily Living    02/29/2024    8:50 AM 02/25/2024    9:25 AM  In  your present state of health, do you have any difficulty performing the following activities:  Hearing? 0 0  Vision? 0 0  Difficulty concentrating or making decisions? 0 0  Walking or climbing stairs? 0   Dressing or bathing? 0 0  Doing errands, shopping? 0 0  Preparing Food and eating ? N N  Using the Toilet? N N  In the past six months, have you accidently leaked urine? N N  Do you have problems with loss of bowel control? N N  Managing your Medications? N N  Managing your Finances? N N  Housekeeping or managing your Housekeeping? N N    Patient Care Team: Manette Section, MD as PCP - General (Family Medicine)  Indicate any recent Medical Services you may have received from other than [facility] providers in the past year (date may be approximate).     Assessment:   This is a routine wellness examination for [place].  Hearing/Vision screen No results found.   Goals Addressed             This Visit's Progress    Patient Stated       Needs to work on weight      Depression Screen    02/29/2024    8:52 AM 04/12/2023    9:07 AM  PHQ 2/9 Scores  PHQ - 2 Score 0 0  PHQ- 9 Score  0    Fall Risk    02/29/2024    8:54 AM 02/25/2024    9:25 AM 04/12/2023    9:07 AM  Fall Risk   Falls in the past year? 0 0 0  Number falls in past yr: 0 1 0  Injury with Fall? 0 0 0  Risk for fall due to : No Fall Risks  No Fall Risks  Follow up Falls evaluation completed   Falls evaluation completed    MEDICARE RISK AT HOME: Medicare Risk at Home Any stairs in or around the home?: No If so, are there any without handrails?: No Home free of loose throw rugs in walkways, pet beds, electrical cords, etc?: Yes Adequate lighting in your home to reduce risk of falls?: Yes Life alert?: Yes Use of a cane, walker or w/c?: No Grab bars in the bathroom?: No Shower chair or bench in shower?: No Elevated toilet seat or a handicapped toilet?: No  TIMED UP AND GO:  Was the test performed?  Yes  Length of time to ambulate 10 feet: 6 sec Gait steady and fast without use of assistive device    Cognitive Function:        02/29/2024    8:55 AM  6CIT Screen  What Year? 0 points  What month? 0 points  What time? 0 points  Count back from 20 0 points  Months in reverse 0 points  Repeat phrase 0 points  Total Score 0 points    Immunizations Immunization History  Administered Date(s) Administered   Influenza Whole 08/13/2020   Influenza, High Dose Seasonal PF 08/03/2021, 07/22/2022   Influenza,inj,Quad PF,6+ Mos 08/06/2017   Influenza,inj,quad, With Preservative 07/14/2015   Influenza-Unspecified 07/18/2023   PFIZER(Purple Top)SARS-COV-2 Vaccination 07/02/2020   PNEUMOCOCCAL CONJUGATE-20 02/21/2023   Pfizer Covid-19 Vaccine Bivalent Booster 13yrs & up 07/29/2021   Pneumococcal Conjugate-13 09/01/2021    TDAP status: Due, Education has been provided regarding the importance of this vaccine. Advised may receive this vaccine at local pharmacy or Health Dept. Aware to provide a copy of the vaccination record if  obtained from local pharmacy or Health Dept. Verbalized acceptance and understanding.  Flu Vaccine status: Up to date  Pneumococcal vaccine status: Up to date  Covid-19 vaccine status: Completed vaccines  Qualifies for Shingles Vaccine? Yes   Zostavax completed No   Shingrix Completed?: No.    Education has been provided regarding the importance of  this vaccine. Patient has been advised to call insurance company to determine out of pocket expense if they have not yet received this vaccine. Advised may also receive vaccine at local pharmacy or Health Dept. Verbalized acceptance and understanding.  Screening Tests Health Maintenance  Topic Date Due   Hepatitis C Screening  Never done   Zoster Vaccines- Shingrix (1 of 2) Never done   MAMMOGRAM  11/08/2023   FOOT EXAM  04/11/2024   HEMOGLOBIN A1C  04/21/2024   OPHTHALMOLOGY EXAM  05/16/2024   INFLUENZA VACCINE  05/23/2024   Diabetic kidney evaluation - eGFR measurement  07/12/2024   Diabetic kidney evaluation - Urine ACR  07/12/2024   Medicare Annual Wellness (AWV)  02/28/2025   Colonoscopy  08/09/2028   Pneumonia Vaccine 54+ Years old  Completed   DEXA SCAN  Completed   HPV VACCINES  Aged Out   Meningococcal B Vaccine  Aged Out   DTaP/Tdap/Td  Discontinued   COVID-19 Vaccine  Discontinued    Health Maintenance  Health Maintenance Due  Topic Date Due   Hepatitis C Screening  Never done   Zoster Vaccines- Shingrix (1 of 2) Never done   MAMMOGRAM  11/08/2023    Colorectal cancer screening: Type of screening: Colonoscopy. Completed 08/10/23. Repeat every 5 years  Mammogram status: Completed 04/2024. Repeat every year  Bone Density status: Completed 02/21/23. Results reflect: Bone density results: NORMAL. Repeat every 5 years.  Lung Cancer Screening: (Low Dose CT Chest recommended if Age 91-80 years, 20 pack-year currently smoking OR have quit w/in 15years.) does not qualify.   Lung Cancer Screening Referral: N/A  Additional Screening:  Hepatitis C Screening: does not qualify; Completed N/A  Vision Screening: Recommended annual ophthalmology exams for early detection of glaucoma and other disorders of the eye. Is the patient up to date with their annual eye exam?  Yes  Who is the provider or what is the name of the office in which the patient attends annual eye exams?  [facility] July 2024 If pt is not established with a provider, would they like to be referred to a provider to establish care? No .   Dental Screening: Recommended annual dental exams for proper oral hygiene  Diabetic Foot Exam: Diabetic Foot Exam: Completed 04/12/2023  Community Resource Referral / Chronic Care Management: CRR required this visit?  No   CCM required this visit?  No     Plan:     I have personally reviewed and noted the following in the patient's chart:   Medical and social history Use of alcohol, tobacco or illicit drugs  Current medications and supplements including opioid prescriptions. Patient is not currently taking opioid prescriptions. Functional ability and status Nutritional status Physical activity Advanced directives List of other physicians Hospitalizations, surgeries, and ER visits in previous 12 months Vitals Screenings to include cognitive, depression, and falls Referrals and appointments  In addition, I have reviewed and discussed with patient certain preventive protocols, quality metrics, and best practice recommendations. A written personalized care plan for preventive services as well as general preventive health recommendations were provided to patient.     Izella Marshal  Courtland Ditch, MD   02/29/2024   After Visit Summary: (In Person-Printed) AVS printed and given to the patient  Nurse Notes: N/A

## 2024-02-29 NOTE — Patient Instructions (Signed)
  Jasmine Harris , Thank you for taking time to come for your Medicare Wellness Visit. I appreciate your ongoing commitment to your health goals. Please review the following plan we discussed and let me know if I can assist you in the future.   These are the goals we discussed:  Goals      Patient Stated     Needs to work on weight        This is a list of the screening recommended for you and due dates:  Health Maintenance  Topic Date Due   Hepatitis C Screening  Never done   Zoster (Shingles) Vaccine (1 of 2) Never done   Mammogram  11/08/2023   Medicare Annual Wellness Visit  02/21/2024   Complete foot exam   04/11/2024   Hemoglobin A1C  04/21/2024   Eye exam for diabetics  05/16/2024   Flu Shot  05/23/2024   Yearly kidney function blood test for diabetes  07/12/2024   Yearly kidney health urinalysis for diabetes  07/12/2024   Colon Cancer Screening  08/09/2028   Pneumonia Vaccine  Completed   DEXA scan (bone density measurement)  Completed   HPV Vaccine  Aged Out   Meningitis B Vaccine  Aged Out   DTaP/Tdap/Td vaccine  Discontinued   COVID-19 Vaccine  Discontinued

## 2024-03-19 ENCOUNTER — Other Ambulatory Visit: Payer: Self-pay | Admitting: Family Medicine

## 2024-03-19 DIAGNOSIS — M62838 Other muscle spasm: Secondary | ICD-10-CM

## 2024-03-20 ENCOUNTER — Encounter: Payer: Self-pay | Admitting: Family Medicine

## 2024-04-08 ENCOUNTER — Encounter: Payer: Self-pay | Admitting: Family Medicine

## 2024-04-08 DIAGNOSIS — Z6834 Body mass index (BMI) 34.0-34.9, adult: Secondary | ICD-10-CM

## 2024-04-08 DIAGNOSIS — Z794 Long term (current) use of insulin: Secondary | ICD-10-CM

## 2024-04-08 MED ORDER — ATORVASTATIN CALCIUM 20 MG PO TABS: 20.0000 mg | ORAL_TABLET | Freq: Every day | ORAL | 1 refills | Status: DC

## 2024-04-08 MED ORDER — SEMAGLUTIDE (1 MG/DOSE) 4 MG/3ML ~~LOC~~ SOPN
1.0000 mg | PEN_INJECTOR | SUBCUTANEOUS | 1 refills | Status: DC
Start: 2024-04-08 — End: 2024-07-23

## 2024-04-08 NOTE — Telephone Encounter (Signed)
Paperwork has been successfully faxed.

## 2024-04-13 ENCOUNTER — Other Ambulatory Visit: Payer: Self-pay | Admitting: Family Medicine

## 2024-04-13 DIAGNOSIS — M62838 Other muscle spasm: Secondary | ICD-10-CM

## 2024-05-10 ENCOUNTER — Other Ambulatory Visit: Payer: Self-pay | Admitting: Gastroenterology

## 2024-05-12 ENCOUNTER — Other Ambulatory Visit: Payer: Self-pay | Admitting: Family Medicine

## 2024-05-12 DIAGNOSIS — M62838 Other muscle spasm: Secondary | ICD-10-CM

## 2024-05-14 ENCOUNTER — Ambulatory Visit (INDEPENDENT_AMBULATORY_CARE_PROVIDER_SITE_OTHER): Admitting: Family Medicine

## 2024-05-14 DIAGNOSIS — J4531 Mild persistent asthma with (acute) exacerbation: Secondary | ICD-10-CM

## 2024-05-14 MED ORDER — METHYLPREDNISOLONE 4 MG PO TBPK: ORAL_TABLET | ORAL | 0 refills | Status: DC

## 2024-05-14 MED ORDER — ALBUTEROL SULFATE HFA 108 (90 BASE) MCG/ACT IN AERS
2.0000 | INHALATION_SPRAY | RESPIRATORY_TRACT | 5 refills | Status: DC | PRN
Start: 2024-05-14 — End: 2024-08-15

## 2024-05-14 MED ORDER — FLUTICASONE-SALMETEROL 250-50 MCG/ACT IN AEPB: 1.0000 | INHALATION_SPRAY | Freq: Two times a day (BID) | RESPIRATORY_TRACT | 5 refills | Status: AC

## 2024-05-14 NOTE — Progress Notes (Signed)
 Established Patient Office Visit  Subjective   Patient ID: Jasmine Harris, female    DOB: 07/26/1956  Age: 68 y.o. MRN: [id number]  Chief Complaint  Patient presents with   Cough    Patient states that her symptoms started on Sunday night with cough (  non Productive), chest/head congestion, and wheezing     Cough Associated symptoms include wheezing. Pertinent negatives include no chest pain or shortness of breath.    Cough Pt reports cough and chest congestion in her chest. She denies smoking but has smoke exposure on Sunday when her cough and congestion started. She also reports wheezing. She has hx of asthma. Reports not doing her Advair daily nor does she have her albuterol  inhaler.  Denies chest pains or SOB.   Review of Systems  Respiratory:  Positive for cough and wheezing. Negative for shortness of breath.   Cardiovascular:  Negative for chest pain.  All other systems reviewed and are negative.     Objective:     BP 136/68   Pulse 65   Temp 98.2 F (36.8 C) (Oral)   Resp 18   Ht 5' 2 (1.575 m)   Wt 175 lb 6.4 oz (79.6 kg)   SpO2 100%   BMI 32.08 kg/m    Physical Exam Vitals and nursing note reviewed.  Constitutional:      Appearance: Normal appearance. She is normal weight.  HENT:     Head: Normocephalic and atraumatic.     Right Ear: External ear normal.     Left Ear: External ear normal.     Nose: Nose normal.     Mouth/Throat:     Mouth: Mucous membranes are moist.     Pharynx: Oropharynx is clear.  Eyes:     Conjunctiva/sclera: Conjunctivae normal.     Pupils: Pupils are equal, round, and reactive to light.  Cardiovascular:     Rate and Rhythm: Normal rate and regular rhythm.     Pulses: Normal pulses.     Heart sounds: Normal heart sounds.  Pulmonary:     Effort: Pulmonary effort is normal.     Breath sounds: Wheezing present.  Skin:    General: Skin is warm.     Capillary Refill: Capillary refill takes less than 2 seconds.   Neurological:     General: No focal deficit present.     Mental Status: She is alert and oriented to person, place, and time. Mental status is at baseline.  Psychiatric:        Mood and Affect: Mood normal.        Behavior: Behavior normal.        Thought Content: Thought content normal.        Judgment: Judgment normal.     No results found for any visits on 05/14/24.    The 10-year ASCVD risk score (Arnett DK, et al., 2019) is: 26.1%    Assessment & Plan:   Problem List Items Addressed This Visit   None Mild persistent asthma with acute exacerbation -     methylPREDNISolone ; 6-day pack as directed  Dispense: 21 tablet; Refill: 0 -     Albuterol  Sulfate HFA; Inhale 2 puffs into the lungs every 4 (four) hours as needed for wheezing or shortness of breath.  Dispense: 9 g; Refill: 5 -     Fluticasone -Salmeterol; Inhale 1 puff into the lungs in the morning and at bedtime.  Dispense: 60 each; Refill: 5   Pt with asthma exacerbation.  Haven't been compliant with asthma medications. Explained to pt on maintenance inhaler usage daily and rinse mouth after every usage. Then have albuterol  inhaler to use prn for SOB/wheezing. Add Medrol  dose pack and avoid triggers when possible.    No follow-ups on file.    Torrence CINDERELLA Barrier, MD

## 2024-06-04 ENCOUNTER — Other Ambulatory Visit: Payer: Self-pay | Admitting: Family Medicine

## 2024-06-06 ENCOUNTER — Ambulatory Visit (INDEPENDENT_AMBULATORY_CARE_PROVIDER_SITE_OTHER): Admitting: Family Medicine

## 2024-06-06 ENCOUNTER — Encounter: Payer: Self-pay | Admitting: Family Medicine

## 2024-06-06 VITALS — BP 128/78 | HR 65 | Temp 98.8°F | Resp 18 | Ht 62.0 in | Wt 178.3 lb

## 2024-06-06 DIAGNOSIS — M25512 Pain in left shoulder: Secondary | ICD-10-CM | POA: Diagnosis not present

## 2024-06-06 DIAGNOSIS — Z794 Long term (current) use of insulin: Secondary | ICD-10-CM | POA: Diagnosis not present

## 2024-06-06 DIAGNOSIS — Z1231 Encounter for screening mammogram for malignant neoplasm of breast: Secondary | ICD-10-CM

## 2024-06-06 DIAGNOSIS — G8929 Other chronic pain: Secondary | ICD-10-CM

## 2024-06-06 DIAGNOSIS — J4541 Moderate persistent asthma with (acute) exacerbation: Secondary | ICD-10-CM | POA: Diagnosis not present

## 2024-06-06 DIAGNOSIS — E119 Type 2 diabetes mellitus without complications: Secondary | ICD-10-CM | POA: Diagnosis not present

## 2024-06-06 DIAGNOSIS — I10 Essential (primary) hypertension: Secondary | ICD-10-CM

## 2024-06-06 LAB — POCT GLYCOSYLATED HEMOGLOBIN (HGB A1C): Hemoglobin A1C: 6.2 % — AB (ref 4.0–5.6)

## 2024-06-06 MED ORDER — CELECOXIB 100 MG PO CAPS: 100.0000 mg | ORAL_CAPSULE | Freq: Two times a day (BID) | ORAL | 2 refills | Status: DC | PRN

## 2024-06-06 NOTE — Progress Notes (Signed)
 Established Patient Office Visit  Subjective   Patient ID: Jasmine Harris, female    DOB: 1955/11/28  Age: 68 y.o. MRN: [id number]  Chief Complaint  Patient presents with   Diabetes   Follow-up    Patient is here for a 3 month follow up. Patient also getting HGA1C    Diabetes    Diabetes Pt is here for DM follow up. Checking once a day fasting in the mornings. Averaging 150-220. She is on Metformin 500 mg 2 tabs po BID and Semaglutide 1 mg weekly.   Hypertension Pt here for HTN follow up. She is taking Imdur 60 mg, Aldactone 50 mg daily, and Losartan 100 mg daily.  She reports she took her medicines before coming to the office  Chronic left shoulder pain  Pt reports she still has some left shoulder pain. Has seen Ortho for her hip pain but not her shoulder pain. She is taking otc Tylenol and Ibuprofen prn for the pain. Helping some but she would like more relief.    Asthma Still with a cough but since adding Advair, must better.   Pt would like referral for mammogram.  Review of Systems  Musculoskeletal:  Positive for joint pain.  All other systems reviewed and are negative.     Objective:     BP (!) 152/63   Pulse 65   Temp 98.8 F (37.1 C) (Oral)   Resp 18   Ht 5' 2 (1.575 m)   Wt 178 lb 4.8 oz (80.9 kg)   SpO2 100%   BMI 32.61 kg/m  BP Readings from Last 3 Encounters:  06/06/24 128/78  05/14/24 136/68  02/29/24 126/72      Physical Exam Vitals and nursing note reviewed.  Constitutional:      Appearance: Normal appearance. She is normal weight.  HENT:     Head: Normocephalic and atraumatic.     Right Ear: External ear normal.     Left Ear: External ear normal.     Nose: Nose normal.     Mouth/Throat:     Mouth: Mucous membranes are moist.     Pharynx: Oropharynx is clear.  Eyes:     Conjunctiva/sclera: Conjunctivae normal.     Pupils: Pupils are equal, round, and reactive to light.  Cardiovascular:     Rate and Rhythm: Normal rate and  regular rhythm.     Pulses: Normal pulses.     Heart sounds: Normal heart sounds.  Pulmonary:     Effort: Pulmonary effort is normal.     Breath sounds: Normal breath sounds.  Skin:    General: Skin is warm.     Capillary Refill: Capillary refill takes less than 2 seconds.  Neurological:     General: No focal deficit present.     Mental Status: She is alert and oriented to person, place, and time. Mental status is at baseline.  Psychiatric:        Mood and Affect: Mood normal.        Behavior: Behavior normal.        Thought Content: Thought content normal.        Judgment: Judgment normal.     No results found for any visits on 06/06/24.  Last hemoglobin A1c Lab Results  Component Value Date   HGBA1C 6.2 (A) 06/06/2024      The 10-year ASCVD risk score (Arnett DK, et al., 2019) is: 32.2%    Assessment & Plan:   Problem List Items Addressed  This Visit       Endocrine   Controlled type 2 diabetes mellitus without complication, with long-term current use of insulin (HCC) - Primary   Relevant Orders   POCT glycosylated hemoglobin (Hb A1C)   Controlled type 2 diabetes mellitus without complication, with long-term current use of insulin (HCC) -     POCT glycosylated hemoglobin (Hb A1C)  Essential hypertension  Chronic left shoulder pain -     Celecoxib; Take 1 capsule (100 mg total) by mouth 2 (two) times daily as needed.  Dispense: 60 capsule; Refill: 2  Moderate persistent asthma with acute exacerbation  Screening mammogram for breast cancer -     3D Screening Mammogram, Left and Right; Future   Pt with controlled Diabetes. Continue Metformin 500mg  2 tabs po bid and Semaglutide 1 mg weekly. Blood pressure recheck at goal. Continue to monitor. Chronic left shoulder pain, send in Celebrex 100mg  BID prn. To follow up with Ortho for worsening pain. Chronic cough related to asthma better since starting Advair. To continue for now and advised pt to add Albuterol  inhaler prn. Refer for mammogram  No follow-ups on file.    Torrence CINDERELLA Barrier, MD

## 2024-06-08 ENCOUNTER — Other Ambulatory Visit: Payer: Self-pay | Admitting: Family Medicine

## 2024-06-08 DIAGNOSIS — M62838 Other muscle spasm: Secondary | ICD-10-CM

## 2024-06-13 ENCOUNTER — Encounter: Payer: Self-pay | Admitting: Gastroenterology

## 2024-06-20 ENCOUNTER — Ambulatory Visit
Admission: RE | Admit: 2024-06-20 | Discharge: 2024-06-20 | Disposition: A | Discharge status: Home / Self Care | Source: Ambulatory Visit | Attending: Family Medicine | Admitting: Family Medicine

## 2024-06-20 DIAGNOSIS — Z1231 Encounter for screening mammogram for malignant neoplasm of breast: Secondary | ICD-10-CM

## 2024-06-27 ENCOUNTER — Encounter: Payer: Self-pay | Admitting: Family Medicine

## 2024-06-27 ENCOUNTER — Ambulatory Visit: Payer: Self-pay | Admitting: Family Medicine

## 2024-06-30 MED ORDER — LOSARTAN POTASSIUM 100 MG PO TABS: 100.0000 mg | ORAL_TABLET | Freq: Every day | ORAL | 1 refills | Status: AC

## 2024-06-30 MED ORDER — METFORMIN HCL 1000 MG PO TABS: 1000.0000 mg | ORAL_TABLET | Freq: Two times a day (BID) | ORAL | 1 refills | Status: AC

## 2024-06-30 MED ORDER — SPIRONOLACTONE 50 MG PO TABS: 50.0000 mg | ORAL_TABLET | Freq: Every day | ORAL | 1 refills | Status: AC

## 2024-06-30 MED ORDER — ISOSORBIDE MONONITRATE ER 60 MG PO TB24: 60.0000 mg | ORAL_TABLET | Freq: Every morning | ORAL | 1 refills | Status: AC

## 2024-07-06 ENCOUNTER — Other Ambulatory Visit: Payer: Self-pay | Admitting: Family Medicine

## 2024-07-06 DIAGNOSIS — M62838 Other muscle spasm: Secondary | ICD-10-CM

## 2024-07-09 ENCOUNTER — Other Ambulatory Visit: Payer: Self-pay | Admitting: Gastroenterology

## 2024-07-21 ENCOUNTER — Telehealth: Payer: Self-pay

## 2024-07-21 DIAGNOSIS — R932 Abnormal findings on diagnostic imaging of liver and biliary tract: Secondary | ICD-10-CM

## 2024-07-21 DIAGNOSIS — R748 Abnormal levels of other serum enzymes: Secondary | ICD-10-CM

## 2024-07-21 NOTE — Telephone Encounter (Signed)
-----   Message from [facility] H sent at 12/26/2023  2:27 PM EST ----- Regarding: due for LFTs Patient will be due for LFTs in October  - one day this week

## 2024-07-21 NOTE — Telephone Encounter (Signed)
 Order placed.  MyChart message to patient to go to the lab this week

## 2024-07-23 ENCOUNTER — Other Ambulatory Visit: Payer: Self-pay | Admitting: Family Medicine

## 2024-07-23 DIAGNOSIS — Z6834 Body mass index (BMI) 34.0-34.9, adult: Secondary | ICD-10-CM

## 2024-07-23 DIAGNOSIS — E119 Type 2 diabetes mellitus without complications: Secondary | ICD-10-CM

## 2024-07-24 NOTE — Telephone Encounter (Signed)
 Called and left message for patient to go to the lab for LFTs

## 2024-07-28 ENCOUNTER — Other Ambulatory Visit (INDEPENDENT_AMBULATORY_CARE_PROVIDER_SITE_OTHER)

## 2024-07-28 DIAGNOSIS — R748 Abnormal levels of other serum enzymes: Secondary | ICD-10-CM | POA: Diagnosis not present

## 2024-07-28 DIAGNOSIS — R932 Abnormal findings on diagnostic imaging of liver and biliary tract: Secondary | ICD-10-CM | POA: Diagnosis not present

## 2024-07-29 ENCOUNTER — Ambulatory Visit: Payer: Self-pay | Admitting: Gastroenterology

## 2024-07-29 LAB — HEPATIC FUNCTION PANEL
ALT: 8 U/L (ref 0–35)
AST: 13 U/L (ref 0–37)
Albumin: 4.4 g/dL (ref 3.5–5.2)
Alkaline Phosphatase: 94 U/L (ref 39–117)
Bilirubin, Direct: -0.1 mg/dL — ABNORMAL LOW (ref 0.0–0.3)
Total Bilirubin: 0.3 mg/dL (ref 0.2–1.2)
Total Protein: 7.3 g/dL (ref 6.0–8.3)

## 2024-07-30 NOTE — Telephone Encounter (Signed)
 Forwarded to [employer]. Ok to schedule with [facility] APP

## 2024-07-31 ENCOUNTER — Other Ambulatory Visit: Payer: Self-pay | Admitting: Family Medicine

## 2024-07-31 DIAGNOSIS — M62838 Other muscle spasm: Secondary | ICD-10-CM

## 2024-08-15 ENCOUNTER — Other Ambulatory Visit: Payer: Self-pay | Admitting: Family Medicine

## 2024-08-15 DIAGNOSIS — J4531 Mild persistent asthma with (acute) exacerbation: Secondary | ICD-10-CM

## 2024-08-17 ENCOUNTER — Other Ambulatory Visit: Payer: Self-pay | Admitting: Family Medicine

## 2024-08-17 DIAGNOSIS — E119 Type 2 diabetes mellitus without complications: Secondary | ICD-10-CM

## 2024-08-17 DIAGNOSIS — Z6834 Body mass index (BMI) 34.0-34.9, adult: Secondary | ICD-10-CM

## 2024-08-26 ENCOUNTER — Other Ambulatory Visit: Payer: Self-pay | Admitting: Family Medicine

## 2024-08-26 DIAGNOSIS — M62838 Other muscle spasm: Secondary | ICD-10-CM

## 2024-08-26 DIAGNOSIS — G8929 Other chronic pain: Secondary | ICD-10-CM

## 2024-08-30 ENCOUNTER — Other Ambulatory Visit: Payer: Self-pay | Admitting: Family Medicine

## 2024-09-03 ENCOUNTER — Other Ambulatory Visit: Payer: Self-pay | Admitting: Family Medicine

## 2024-09-03 DIAGNOSIS — J4531 Mild persistent asthma with (acute) exacerbation: Secondary | ICD-10-CM

## 2024-09-15 ENCOUNTER — Encounter: Payer: Self-pay | Admitting: Gastroenterology

## 2024-09-15 ENCOUNTER — Ambulatory Visit: Admitting: Gastroenterology

## 2024-09-15 VITALS — BP 120/58 | HR 60 | Ht 62.0 in | Wt 177.4 lb

## 2024-09-15 DIAGNOSIS — R1011 Right upper quadrant pain: Secondary | ICD-10-CM

## 2024-09-15 DIAGNOSIS — K219 Gastro-esophageal reflux disease without esophagitis: Secondary | ICD-10-CM

## 2024-09-15 DIAGNOSIS — Z860100 Personal history of colon polyps, unspecified: Secondary | ICD-10-CM

## 2024-09-15 DIAGNOSIS — K828 Other specified diseases of gallbladder: Secondary | ICD-10-CM | POA: Diagnosis not present

## 2024-09-15 DIAGNOSIS — Z8619 Personal history of other infectious and parasitic diseases: Secondary | ICD-10-CM

## 2024-09-15 DIAGNOSIS — R142 Eructation: Secondary | ICD-10-CM

## 2024-09-15 NOTE — Progress Notes (Signed)
 Chief Complaint: Follow up Primary GI MD: Dr. Leigh  HPI: 68 year old female with a history of colon polyps, abdominal pain, diabetes presents for follow-up  Last seen 07/2023 with Dr. Leigh  1.  Adenomatous colon polyps, family history of colon cancer, her father had colon cancer in his 92s.  Colonoscopy August 2007 showed diverticulosis, no polyps or cancers.  Colonoscopy March 2017 at outside facility, 2 subcentimeter polyps were removed with forceps.  Biopsy showed one was a tubular adenoma and the other was hyperplastic.  Repeat colonoscopy should be around March 2022.. 2.  Possible diverticulitis.  CT scan abdomen pelvis with IV and oral contrast May 2020 showed diverticulosis, possible diverticulitis  Discussed the use of AI scribe software for clinical note transcription with the patient, who gave verbal consent to proceed.  History of Present Illness  Jasmine Harris is a 68 year old female with gallbladder issues who presents for a follow-up visit.  She experiences intermittent right upper quadrant pain, described as 'muscle spasms', occurring approximately weekly. Pantoprazole  has not provided relief.  Her medical history includes a small gallbladder polyp, fatty liver, and a mildly dilated common bile duct identified on ultrasound last year. An MRI conducted subsequently showed no concerning findings. A HIDA scan revealed a hyperkinetic gallbladder with an ejection fraction of 90%.  She has a history of H. pylori infection, for which she received treatment. It is unclear if a stool study was completed to confirm eradication, but a breath test in August 2024 was negative for H. pylori.  She frequently experiences belching, which she associates with the consumption of carbonated beverages and possibly certain foods. There is no significant relief from pantoprazole  for this symptom.  Her liver enzymes are reportedly normal, and she is not due for a colonoscopy until  2029.   PREVIOUS GI WORKUP   EGD 07/19/20: - Gastritis. Biopsied. - Small hiatal hernia. - Examination otherwise normal.   Surgical [P], gastric antrum and gastric body - CHRONIC ACTIVE GASTRITIS WITH HELICOBACTER PYLORI. - WARTHIN-STARRY IS POSITIVE FOR HELICOBACTER PYLORI. - NO INTESTINAL METAPLASIA, DYSPLASIA, OR MALIGNANCY   Recommended to treat with Pylera  at the time but she never took it   H pylori breath test positive 04/12/23 - treated    H pylori breath test NEGATIVE 06/15/23     RUQ US  06/22/23: IMPRESSION: 1. No acute abnormality identified. 2. 0.5 cm gallbladder polyp. No follow-up necessary. 3. Increased echotexture of the liver, nonspecific but may represent hepatic steatosis. 4. Mildly dilated common bile duct measuring 7.6 mm. Recommend correlation with LFTs. If abnormal, recommend further evaluation with MRCP or ERCP.    Past Medical History:  Diagnosis Date   Asthma    Cellulitis and abscess of unspecified site    Digestive-genital tract fistula, female    GERD (gastroesophageal reflux disease)    Hyperlipidemia    Hypertension    Obesity, unspecified    Seasonal allergies    Type II or unspecified type diabetes mellitus without mention of complication, uncontrolled    Vaginitis and vulvovaginitis, unspecified     Past Surgical History:  Procedure Laterality Date   boils removed     pubic area   CESAREAN SECTION     x2   COLONOSCOPY  2017   [facility]   ESOPHAGOGASTRODUODENOSCOPY     HIP ARTHROPLASTY Left 11/17/2021    Current Outpatient Medications  Medication Sig Dispense Refill   albuterol  (VENTOLIN  HFA) 108 (90 Base) MCG/ACT inhaler INHALE 2 PUFFS BY  MOUTH EVERY 4 HOURS AS NEEDED FOR WHEEZING FOR SHORTNESS OF BREATH 9 g 0   aspirin EC 81 MG tablet Take 81 mg by mouth daily.     atorvastatin  (LIPITOR) 20 MG tablet Take 1 tablet (20 mg total) by mouth daily. 90 tablet 1   celecoxib  (CELEBREX ) 100 MG capsule TAKE 1 CAPSULE BY MOUTH TWICE  DAILY AS NEEDED 60 capsule 0   cetirizine (ZYRTEC) 10 MG tablet Take 10 mg by mouth daily.     famotidine  (PEPCID ) 20 MG tablet Take 1 tablet (20 mg total) by mouth as needed for heartburn or indigestion.     fluticasone -salmeterol (ADVAIR) 250-50 MCG/ACT AEPB Inhale 1 puff into the lungs in the morning and at bedtime. 60 each 5   furosemide  (LASIX ) 40 MG tablet TAKE 1 TABLET BY MOUTH ONCE DAILY IN THE MORNING 30 tablet 0   isosorbide  mononitrate (IMDUR ) 60 MG 24 hr tablet Take 1 tablet (60 mg total) by mouth every morning. 90 tablet 1   losartan  (COZAAR ) 100 MG tablet Take 1 tablet (100 mg total) by mouth daily. 90 tablet 1   metFORMIN  (GLUCOPHAGE ) 1000 MG tablet Take 1 tablet (1,000 mg total) by mouth 2 (two) times daily with a meal. 180 tablet 1   montelukast  (SINGULAIR ) 10 MG tablet TAKE 1 TABLET BY MOUTH AT BEDTIME 30 tablet 0   pantoprazole  (PROTONIX ) 40 MG tablet Take 1 tablet (40 mg total) by mouth daily. Please schedule a yearly follow up for further refills. Thank you 30 tablet 1   Semaglutide , 1 MG/DOSE, (OZEMPIC , 1 MG/DOSE,) 4 MG/3ML SOPN INJECT 1 MG SUBCUTANEOUSLY ONCE A WEEK 3 mL 2   spironolactone  (ALDACTONE ) 50 MG tablet Take 1 tablet (50 mg total) by mouth daily. 90 tablet 1   tiZANidine  (ZANAFLEX ) 4 MG tablet TAKE 1 TO 2 TABLETS BY MOUTH EVERY 8 HOURS AS NEEDED 90 tablet 0   No current facility-administered medications for this visit.    Allergies as of 09/15/2024 - Review Complete 09/15/2024  Allergen Reaction Noted   Penicillins Hives and Itching 09/01/2021    Family History  Problem Relation Age of Onset   Heart disease Mother    Colon cancer Father 51   Esophageal cancer Neg Hx    Rectal cancer Neg Hx    Stomach cancer Neg Hx    Breast cancer Neg Hx     Social History   Socioeconomic History   Marital status: Divorced    Spouse name: Not on file   Number of children: 2   Years of education: Not on file   Highest education level: Associate degree: academic  program  Occupational History   Occupation: receptionist  Tobacco Use   Smoking status: Former    Passive exposure: Past   Smokeless tobacco: Never  Vaping Use   Vaping status: Never Used  Substance and Sexual Activity   Alcohol use: Yes    Comment: occasional; once monthly wine   Drug use: Never   Sexual activity: Not Currently    Birth control/protection: Post-menopausal  Other Topics Concern   Not on file  Social History Narrative   Not on file   Social Drivers of Health   Financial Resource Strain: Low Risk  (05/14/2024)   Overall Financial Resource Strain (CARDIA)    Difficulty of Paying Living Expenses: Not hard at all  Food Insecurity: No Food Insecurity (05/14/2024)   Hunger Vital Sign    Worried About Running Out of Food in the Last Year:  Never true    Ran Out of Food in the Last Year: Never true  Transportation Needs: No Transportation Needs (05/14/2024)   PRAPARE - Administrator, Civil Service (Medical): No    Lack of Transportation (Non-Medical): No  Physical Activity: Insufficiently Active (05/14/2024)   Exercise Vital Sign    Days of Exercise per Week: 2 days    Minutes of Exercise per Session: 30 min  Stress: No Stress Concern Present (05/14/2024)   [employer] of Occupational Health - Occupational Stress Questionnaire    Feeling of Stress: Not at all  Social Connections: Moderately Integrated (05/14/2024)   Social Connection and Isolation Panel    Frequency of Communication with Friends and Family: More than three times a week    Frequency of Social Gatherings with Friends and Family: Once a week    Attends Religious Services: More than 4 times per year    Active Member of [employer] or Organizations: Yes    Attends Engineer, Structural: More than 4 times per year    Marital Status: Divorced  Intimate Partner Violence: Not At Risk (02/29/2024)   Humiliation, Afraid, Rape, and Kick questionnaire    Fear of Current or Ex-Partner: No     Emotionally Abused: No    Physically Abused: No    Sexually Abused: No    Review of Systems:    Constitutional: No weight loss, fever, chills, weakness or fatigue HEENT: Eyes: No change in vision               Ears, Nose, Throat:  No change in hearing or congestion Skin: No rash or itching Cardiovascular: No chest pain, chest pressure or palpitations   Respiratory: No SOB or cough Gastrointestinal: See HPI and otherwise negative Genitourinary: No dysuria or change in urinary frequency Neurological: No headache, dizziness or syncope Musculoskeletal: No new muscle or joint pain Hematologic: No bleeding or bruising Psychiatric: No history of depression or anxiety    Physical Exam:  Vital signs: BP (!) 120/58   Pulse 60   Ht 5' 2 (1.575 m)   Wt 177 lb 6 oz (80.5 kg)   BMI 32.44 kg/m   Constitutional: NAD, alert and cooperative Head:  Normocephalic and atraumatic. Eyes:   PEERL, EOMI. No icterus. Conjunctiva pink. Respiratory: Respirations even and unlabored. Lungs clear to auscultation bilaterally.   No wheezes, crackles, or rhonchi.  Cardiovascular:  Regular rate and rhythm. No peripheral edema, cyanosis or pallor.  Gastrointestinal:  Soft, nondistended, nontender. No rebound or guarding. Normal bowel sounds. No appreciable masses or hepatomegaly. Rectal:  Declines Msk:  Symmetrical without gross deformities. Without edema, no deformity or joint abnormality.  Neurologic:  Alert and  oriented x4;  grossly normal neurologically.  Skin:   Dry and intact without significant lesions or rashes. Psychiatric: Oriented to person, place and time. Demonstrates good judgement and reason without abnormal affect or behaviors.   RELEVANT LABS AND IMAGING: CBC    Component Value Date/Time   WBC 9.2 02/06/2014 1229   RBC 5.28 (H) 02/06/2014 1229   HGB 13.4 02/06/2014 1229   HCT 40.9 02/06/2014 1229   PLT 271 02/06/2014 1229   MCV 77.5 (L) 02/06/2014 1229   MCH 25.4 (L) 02/06/2014  1229   MCHC 32.8 02/06/2014 1229   RDW 13.4 02/06/2014 1229   LYMPHSABS 4.2 (H) 07/12/2009 1700   MONOABS 0.7 07/12/2009 1700   EOSABS 0.3 07/12/2009 1700   BASOSABS 0.0 07/12/2009 1700    CMP  Component Value Date/Time   NA 141 07/13/2023 0838   K 4.6 07/13/2023 0838   CL 100 07/13/2023 0838   CO2 23 07/13/2023 0838   GLUCOSE 176 (H) 07/13/2023 0838   GLUCOSE 133 (H) 02/06/2014 1229   BUN 5 (L) 07/13/2023 0838   CREATININE 0.78 07/13/2023 0838   CALCIUM  10.0 07/13/2023 0838   PROT 7.3 07/28/2024 1239   PROT 6.9 07/13/2023 0838   ALBUMIN 4.4 07/28/2024 1239   ALBUMIN 4.5 07/13/2023 0838   AST 13 07/28/2024 1239   ALT 8 07/28/2024 1239   ALKPHOS 94 07/28/2024 1239   BILITOT 0.3 07/28/2024 1239   BILITOT 0.3 07/13/2023 0838   GFRNONAA >90 02/06/2014 1229   GFRAA 78 02/21/2023 0000     Assessment/Plan:    RUQ pain History of H pylori Biliary hyperkinesia RUQ US  with hepatic steatosis, small gallbladder polyp. Mildly dilated CBD and alk phos mildly elevated. Previous EGD with positive h pylori s/p treatment with negative breath test 05/2023. MRCP with 7.5-69mm extrahepatic bile duct gradually tapering near ampulla Vader.  HIDA scan with gallbladder hyperkinesia.  Has not seen [facility].  LFTs normal. Continuing RUQ pain a few times per week. -- refer to [facility]  Belching Reported belching. Diet high in FODMAPs and carbonation -- dietary modifications including exclusion of carbonated beverages and foods high in FODMAPs -- recent EGD -- can add in pepcid  prn  Adenomatous colon polyps, family history of colon cancer,  her father had colon cancer in his 69s.   Colonoscopy 07/2023 with 3 hyperplastic polyps ranging 3 mm 5 mm and recall 5 years due to family history - Due 07/2028   Jasmine Harris, [facility] 09/15/2024, 11:16 AM  Cc: Colette Torrence GRADE, MD

## 2024-09-15 NOTE — Patient Instructions (Signed)
 _______________________________________________________  If your blood pressure at your visit was 140/90 or greater, please contact your primary care physician to follow up on this.  _______________________________________________________  If you are age 68 or older, your body mass index should be between 23-30. Your Body mass index is 32.44 kg/m. If this is out of the aforementioned range listed, please consider follow up with your Primary Care Provider.  If you are age 45 or younger, your body mass index should be between 19-25. Your Body mass index is 32.44 kg/m. If this is out of the aformentioned range listed, please consider follow up with your Primary Care Provider.   ________________________________________________________  The [facility] providers would like to encourage you to use MYCHART to communicate with providers for non-urgent requests or questions.  Due to long hold times on the telephone, sending your provider a message by [facility] may be a faster and more efficient way to get a response.  Please allow 48 business hours for a response.  Please remember that this is for non-urgent requests.  _______________________________________________________  [facility] is using a team-based approach to care.  Your team is made up of your doctor and two to three APPS. Our APPS (Nurse Practitioners and Physician Assistants) work with your physician to ensure care continuity for you. They are fully qualified to address your health concerns and develop a treatment plan. They communicate directly with your gastroenterologist to care for you. Seeing the Advanced Practice Practitioners on your physician's team can help you by facilitating care more promptly, often allowing for earlier appointments, access to diagnostic testing, procedures, and other specialty referrals.   You have been scheduled for an appointment with ___________ at [facility]. Your appointment is on  __________ at ________. Please arrive at ________ for registration. Make certain to bring a list of current medications, including any over the counter medications or vitamins. Also bring your co-pay if you have one as well as your insurance cards. [facility] is located at [address]. Should you need to reschedule your appointment, please contact them at [phone].

## 2024-09-17 ENCOUNTER — Other Ambulatory Visit: Payer: Self-pay | Admitting: Family Medicine

## 2024-09-17 NOTE — Progress Notes (Signed)
 Agree with assessment and plan as outlined.

## 2024-09-22 ENCOUNTER — Ambulatory Visit: Payer: Self-pay | Admitting: General Surgery

## 2024-09-29 ENCOUNTER — Other Ambulatory Visit: Payer: Self-pay | Admitting: Family Medicine

## 2024-09-29 DIAGNOSIS — G8929 Other chronic pain: Secondary | ICD-10-CM

## 2024-10-05 ENCOUNTER — Other Ambulatory Visit: Payer: Self-pay | Admitting: Family Medicine

## 2024-10-07 ENCOUNTER — Encounter: Payer: Self-pay | Admitting: Family Medicine

## 2024-10-07 NOTE — Telephone Encounter (Signed)
 I've printed off paperwork and placed it in the work box.

## 2024-10-17 ENCOUNTER — Ambulatory Visit: Admitting: Gastroenterology

## 2024-10-26 ENCOUNTER — Other Ambulatory Visit: Payer: Self-pay | Admitting: Family Medicine

## 2024-10-26 DIAGNOSIS — G8929 Other chronic pain: Secondary | ICD-10-CM

## 2024-11-16 ENCOUNTER — Other Ambulatory Visit: Payer: Self-pay | Admitting: Gastroenterology

## 2024-11-16 ENCOUNTER — Other Ambulatory Visit: Payer: Self-pay | Admitting: Family Medicine

## 2024-11-16 DIAGNOSIS — M62838 Other muscle spasm: Secondary | ICD-10-CM

## 2024-12-12 ENCOUNTER — Ambulatory Visit: Admitting: Family Medicine

## 2024-12-15 ENCOUNTER — Ambulatory Visit (HOSPITAL_COMMUNITY): Admit: 2024-12-15 | Admitting: General Surgery
